# Patient Record
Sex: Female | Born: 1935 | Race: Black or African American | Hispanic: No | Marital: Married | Smoking: Current every day smoker
Health system: Southern US, Community
[De-identification: ages and names within clinical notes are randomized; demographics above are authoritative.]

## PROBLEM LIST (undated history)

## (undated) DIAGNOSIS — M1990 Unspecified osteoarthritis, unspecified site: Secondary | ICD-10-CM

## (undated) DIAGNOSIS — D509 Iron deficiency anemia, unspecified: Secondary | ICD-10-CM

## (undated) DIAGNOSIS — M810 Age-related osteoporosis without current pathological fracture: Secondary | ICD-10-CM

## (undated) DIAGNOSIS — I10 Essential (primary) hypertension: Secondary | ICD-10-CM

## (undated) DIAGNOSIS — D72819 Decreased white blood cell count, unspecified: Secondary | ICD-10-CM

## (undated) DIAGNOSIS — E119 Type 2 diabetes mellitus without complications: Secondary | ICD-10-CM

## (undated) DIAGNOSIS — F17200 Nicotine dependence, unspecified, uncomplicated: Secondary | ICD-10-CM

## (undated) DIAGNOSIS — E7800 Pure hypercholesterolemia, unspecified: Secondary | ICD-10-CM

## (undated) DIAGNOSIS — R9431 Abnormal electrocardiogram [ECG] [EKG]: Secondary | ICD-10-CM

## (undated) DIAGNOSIS — Z86010 Personal history of colonic polyps: Secondary | ICD-10-CM

## (undated) DIAGNOSIS — J309 Allergic rhinitis, unspecified: Secondary | ICD-10-CM

## (undated) DIAGNOSIS — M25519 Pain in unspecified shoulder: Secondary | ICD-10-CM

## (undated) HISTORY — DX: Personal history of colonic polyps: Z86.010

## (undated) HISTORY — DX: Decreased white blood cell count, unspecified: D72.819

## (undated) HISTORY — DX: Unspecified osteoarthritis, unspecified site: M19.90

## (undated) HISTORY — DX: Iron deficiency anemia, unspecified: D50.9

## (undated) HISTORY — DX: Type 2 diabetes mellitus without complications: E11.9

## (undated) HISTORY — PX: OTHER SURGICAL HISTORY: SHX169

## (undated) HISTORY — DX: Pain in unspecified shoulder: M25.519

## (undated) HISTORY — DX: Allergic rhinitis, unspecified: J30.9

## (undated) HISTORY — DX: Pure hypercholesterolemia, unspecified: E78.00

## (undated) HISTORY — DX: Age-related osteoporosis without current pathological fracture: M81.0

## (undated) HISTORY — DX: Abnormal electrocardiogram (ECG) (EKG): R94.31

## (undated) HISTORY — DX: Nicotine dependence, unspecified, uncomplicated: F17.200

## (undated) HISTORY — DX: Essential (primary) hypertension: I10

## (undated) HISTORY — PX: DILATION AND CURETTAGE OF UTERUS: SHX78

---

## 1999-09-14 ENCOUNTER — Ambulatory Visit (HOSPITAL_COMMUNITY): Admission: RE | Admit: 1999-09-14 | Discharge: 1999-09-14 | Payer: Self-pay | Admitting: Endocrinology

## 1999-09-14 ENCOUNTER — Encounter: Payer: Self-pay | Admitting: Endocrinology

## 1999-11-09 ENCOUNTER — Other Ambulatory Visit: Admission: RE | Admit: 1999-11-09 | Discharge: 1999-11-09 | Payer: Self-pay | Admitting: Gastroenterology

## 1999-11-09 ENCOUNTER — Encounter (INDEPENDENT_AMBULATORY_CARE_PROVIDER_SITE_OTHER): Payer: Self-pay | Admitting: Specialist

## 2002-02-04 ENCOUNTER — Other Ambulatory Visit: Admission: RE | Admit: 2002-02-04 | Discharge: 2002-02-04 | Payer: Self-pay | Admitting: Endocrinology

## 2002-05-29 ENCOUNTER — Encounter: Payer: Self-pay | Admitting: Gastroenterology

## 2002-05-29 ENCOUNTER — Ambulatory Visit (HOSPITAL_COMMUNITY): Admission: RE | Admit: 2002-05-29 | Discharge: 2002-05-29 | Payer: Self-pay | Admitting: Gastroenterology

## 2004-03-01 ENCOUNTER — Ambulatory Visit: Payer: Self-pay | Admitting: Endocrinology

## 2004-06-16 ENCOUNTER — Ambulatory Visit: Payer: Self-pay | Admitting: Endocrinology

## 2004-11-08 ENCOUNTER — Ambulatory Visit: Payer: Self-pay | Admitting: Endocrinology

## 2004-12-20 ENCOUNTER — Ambulatory Visit: Payer: Self-pay | Admitting: Endocrinology

## 2005-02-06 ENCOUNTER — Ambulatory Visit: Payer: Self-pay | Admitting: Endocrinology

## 2005-02-16 ENCOUNTER — Ambulatory Visit: Payer: Self-pay | Admitting: Endocrinology

## 2005-05-17 ENCOUNTER — Ambulatory Visit: Payer: Self-pay | Admitting: Endocrinology

## 2005-09-06 ENCOUNTER — Ambulatory Visit: Payer: Self-pay | Admitting: Endocrinology

## 2005-11-30 ENCOUNTER — Ambulatory Visit: Payer: Self-pay | Admitting: Gastroenterology

## 2005-12-01 ENCOUNTER — Encounter: Payer: Self-pay | Admitting: Gastroenterology

## 2005-12-01 ENCOUNTER — Ambulatory Visit: Payer: Self-pay | Admitting: Gastroenterology

## 2005-12-01 LAB — HM COLONOSCOPY

## 2006-01-04 ENCOUNTER — Ambulatory Visit: Payer: Self-pay | Admitting: Internal Medicine

## 2006-02-28 ENCOUNTER — Ambulatory Visit: Payer: Self-pay | Admitting: Endocrinology

## 2006-04-12 ENCOUNTER — Ambulatory Visit: Payer: Self-pay | Admitting: Endocrinology

## 2006-05-03 ENCOUNTER — Ambulatory Visit: Payer: Self-pay | Admitting: Endocrinology

## 2006-08-10 ENCOUNTER — Ambulatory Visit: Payer: Self-pay | Admitting: Endocrinology

## 2007-01-03 ENCOUNTER — Encounter: Payer: Self-pay | Admitting: *Deleted

## 2007-01-03 DIAGNOSIS — Z860100 Personal history of colon polyps, unspecified: Secondary | ICD-10-CM

## 2007-01-03 DIAGNOSIS — I10 Essential (primary) hypertension: Secondary | ICD-10-CM

## 2007-01-03 DIAGNOSIS — M810 Age-related osteoporosis without current pathological fracture: Secondary | ICD-10-CM

## 2007-01-03 DIAGNOSIS — J309 Allergic rhinitis, unspecified: Secondary | ICD-10-CM | POA: Insufficient documentation

## 2007-01-03 DIAGNOSIS — E119 Type 2 diabetes mellitus without complications: Secondary | ICD-10-CM | POA: Insufficient documentation

## 2007-01-03 DIAGNOSIS — M1990 Unspecified osteoarthritis, unspecified site: Secondary | ICD-10-CM

## 2007-01-03 HISTORY — DX: Unspecified osteoarthritis, unspecified site: M19.90

## 2007-01-03 HISTORY — DX: Type 2 diabetes mellitus without complications: E11.9

## 2007-01-03 HISTORY — DX: Personal history of colon polyps, unspecified: Z86.0100

## 2007-01-03 HISTORY — DX: Personal history of colonic polyps: Z86.010

## 2007-01-03 HISTORY — DX: Essential (primary) hypertension: I10

## 2007-01-03 HISTORY — DX: Allergic rhinitis, unspecified: J30.9

## 2007-01-03 HISTORY — DX: Age-related osteoporosis without current pathological fracture: M81.0

## 2007-02-12 ENCOUNTER — Ambulatory Visit: Payer: Self-pay | Admitting: Endocrinology

## 2007-02-12 DIAGNOSIS — R9431 Abnormal electrocardiogram [ECG] [EKG]: Secondary | ICD-10-CM

## 2007-02-12 HISTORY — DX: Abnormal electrocardiogram (ECG) (EKG): R94.31

## 2007-02-12 LAB — CONVERTED CEMR LAB
ALT: 14 units/L (ref 0–35)
AST: 23 units/L (ref 0–37)
Albumin: 3.7 g/dL (ref 3.5–5.2)
Alkaline Phosphatase: 56 units/L (ref 39–117)
BUN: 17 mg/dL (ref 6–23)
Basophils Absolute: 0.1 10*3/uL (ref 0.0–0.1)
Basophils Relative: 1.2 % — ABNORMAL HIGH (ref 0.0–1.0)
Bilirubin, Direct: 0.1 mg/dL (ref 0.0–0.3)
CO2: 31 meq/L (ref 19–32)
Calcium: 9.9 mg/dL (ref 8.4–10.5)
Chloride: 101 meq/L (ref 96–112)
Cholesterol: 166 mg/dL (ref 0–200)
Creatinine, Ser: 0.8 mg/dL (ref 0.4–1.2)
Creatinine,U: 84.8 mg/dL
Eosinophils Absolute: 0.1 10*3/uL (ref 0.0–0.6)
Eosinophils Relative: 1.6 % (ref 0.0–5.0)
GFR calc Af Amer: 88 mL/min
GFR calc non Af Amer: 73 mL/min
Glucose, Bld: 82 mg/dL (ref 70–99)
HCT: 32.5 % — ABNORMAL LOW (ref 36.0–46.0)
HDL: 81.3 mg/dL (ref >39.0)
Hemoglobin: 11.3 g/dL — ABNORMAL LOW (ref 12.0–15.0)
Hgb A1c MFr Bld: 6.3 % — ABNORMAL HIGH (ref 4.6–6.0)
LDL Cholesterol: 76 mg/dL (ref 0–99)
Lymphocytes Relative: 40.9 % (ref 12.0–46.0)
MCHC: 34.7 g/dL (ref 30.0–36.0)
MCV: 92.7 fL (ref 78.0–100.0)
Microalb Creat Ratio: 10.6 mg/g (ref 0.0–30.0)
Microalb, Ur: 0.9 mg/dL (ref 0.0–1.9)
Monocytes Absolute: 0.4 10*3/uL (ref 0.2–0.7)
Monocytes Relative: 7.8 % (ref 3.0–11.0)
Neutro Abs: 2.2 10*3/uL (ref 1.4–7.7)
Neutrophils Relative %: 48.5 % (ref 43.0–77.0)
Platelets: 208 10*3/uL (ref 150–400)
Potassium: 4.5 meq/L (ref 3.5–5.1)
RBC: 3.51 M/uL — ABNORMAL LOW (ref 3.87–5.11)
RDW: 14 % (ref 11.5–14.6)
Sed Rate: 25 mm/hr (ref 0–25)
Sodium: 138 meq/L (ref 135–145)
TSH: 1.21 microintl units/mL (ref 0.35–5.50)
Total Bilirubin: 0.6 mg/dL (ref 0.3–1.2)
Total CHOL/HDL Ratio: 2
Total Protein: 7.1 g/dL (ref 6.0–8.3)
Triglycerides: 44 mg/dL (ref 0–149)
VLDL: 9 mg/dL (ref 0–40)
WBC: 4.7 10*3/uL (ref 4.5–10.5)

## 2007-02-19 ENCOUNTER — Ambulatory Visit: Payer: Self-pay

## 2007-02-19 ENCOUNTER — Encounter: Payer: Self-pay | Admitting: Endocrinology

## 2007-08-12 ENCOUNTER — Encounter: Payer: Self-pay | Admitting: Endocrinology

## 2007-08-15 ENCOUNTER — Ambulatory Visit: Payer: Self-pay | Admitting: Endocrinology

## 2007-08-15 ENCOUNTER — Encounter (INDEPENDENT_AMBULATORY_CARE_PROVIDER_SITE_OTHER): Payer: Self-pay | Admitting: *Deleted

## 2007-08-15 DIAGNOSIS — D509 Iron deficiency anemia, unspecified: Secondary | ICD-10-CM

## 2007-08-15 HISTORY — DX: Iron deficiency anemia, unspecified: D50.9

## 2007-08-15 LAB — CONVERTED CEMR LAB
Basophils Absolute: 0 10*3/uL (ref 0.0–0.1)
Basophils Relative: 0.9 % (ref 0.0–1.0)
Eosinophils Absolute: 0 10*3/uL (ref 0.0–0.7)
Eosinophils Relative: 1.1 % (ref 0.0–5.0)
HCT: 33.9 % — ABNORMAL LOW (ref 36.0–46.0)
Hemoglobin: 11.4 g/dL — ABNORMAL LOW (ref 12.0–15.0)
Hgb A1c MFr Bld: 6.3 % — ABNORMAL HIGH (ref 4.6–6.0)
Iron: 71 ug/dL (ref 42–145)
Lymphocytes Relative: 28.4 % (ref 12.0–46.0)
MCHC: 33.6 g/dL (ref 30.0–36.0)
MCV: 93.3 fL (ref 78.0–100.0)
Monocytes Absolute: 0.3 10*3/uL (ref 0.1–1.0)
Monocytes Relative: 6.3 % (ref 3.0–12.0)
Neutro Abs: 2.7 10*3/uL (ref 1.4–7.7)
Neutrophils Relative %: 63.3 % (ref 43.0–77.0)
Platelets: 194 10*3/uL (ref 150–400)
RBC: 3.63 M/uL — ABNORMAL LOW (ref 3.87–5.11)
RDW: 14 % (ref 11.5–14.6)
Saturation Ratios: 16.2 % — ABNORMAL LOW (ref 20.0–50.0)
Transferrin: 312.1 mg/dL (ref >=212.0)
WBC: 4.2 10*3/uL — ABNORMAL LOW (ref 4.5–10.5)

## 2007-09-30 ENCOUNTER — Ambulatory Visit: Payer: Self-pay | Admitting: Gastroenterology

## 2007-10-04 ENCOUNTER — Ambulatory Visit: Payer: Self-pay | Admitting: Gastroenterology

## 2007-10-04 ENCOUNTER — Encounter: Payer: Self-pay | Admitting: Gastroenterology

## 2007-10-07 ENCOUNTER — Ambulatory Visit: Payer: Self-pay | Admitting: Gastroenterology

## 2007-10-08 LAB — CONVERTED CEMR LAB
Fecal Occult Blood: NEGATIVE
OCCULT 1: NEGATIVE
OCCULT 2: NEGATIVE
OCCULT 3: NEGATIVE
OCCULT 4: NEGATIVE
OCCULT 5: NEGATIVE

## 2007-10-16 ENCOUNTER — Encounter: Payer: Self-pay | Admitting: Gastroenterology

## 2007-11-04 ENCOUNTER — Ambulatory Visit: Payer: Self-pay | Admitting: Gastroenterology

## 2007-11-04 LAB — CONVERTED CEMR LAB
Basophils Absolute: 0.1 10*3/uL (ref 0.0–0.1)
Basophils Relative: 1 % (ref 0.0–3.0)
Eosinophils Absolute: 0.1 10*3/uL (ref 0.0–0.7)
Eosinophils Relative: 1.4 % (ref 0.0–5.0)
HCT: 33.2 % — ABNORMAL LOW (ref 36.0–46.0)
Hemoglobin: 11.5 g/dL — ABNORMAL LOW (ref 12.0–15.0)
Lymphocytes Relative: 40.9 % (ref 12.0–46.0)
MCHC: 34.7 g/dL (ref 30.0–36.0)
MCV: 93.6 fL (ref 78.0–100.0)
Monocytes Absolute: 0.4 10*3/uL (ref 0.1–1.0)
Monocytes Relative: 7.8 % (ref 3.0–12.0)
Neutro Abs: 2.6 10*3/uL (ref 1.4–7.7)
Neutrophils Relative %: 48.9 % (ref 43.0–77.0)
Platelets: 199 10*3/uL (ref 150–400)
RBC: 3.54 M/uL — ABNORMAL LOW (ref 3.87–5.11)
RDW: 13.5 % (ref 11.5–14.6)
WBC: 5.4 10*3/uL (ref 4.5–10.5)

## 2007-11-19 ENCOUNTER — Ambulatory Visit: Payer: Self-pay | Admitting: Gastroenterology

## 2008-01-17 ENCOUNTER — Ambulatory Visit: Payer: Self-pay | Admitting: Endocrinology

## 2008-02-06 ENCOUNTER — Ambulatory Visit: Payer: Self-pay | Admitting: Family Medicine

## 2008-02-06 ENCOUNTER — Encounter: Payer: Self-pay | Admitting: Endocrinology

## 2008-02-18 ENCOUNTER — Ambulatory Visit: Payer: Self-pay | Admitting: Endocrinology

## 2008-02-18 DIAGNOSIS — M25519 Pain in unspecified shoulder: Secondary | ICD-10-CM

## 2008-02-18 HISTORY — DX: Pain in unspecified shoulder: M25.519

## 2008-02-18 LAB — CONVERTED CEMR LAB: Hgb A1c MFr Bld: 6.2 % — ABNORMAL HIGH (ref 4.6–6.0)

## 2008-02-24 LAB — CONVERTED CEMR LAB
Albumin ELP: 56.6 % (ref 55.8–66.1)
Alpha-1-Globulin: 4.6 % (ref 2.9–4.9)
PTH: 24.5 pg/mL (ref 14.0–72.0)
Total Protein, Serum Electrophoresis: 7.1 g/dL (ref 6.0–8.3)

## 2008-03-04 ENCOUNTER — Ambulatory Visit: Payer: Self-pay

## 2008-05-21 ENCOUNTER — Ambulatory Visit: Payer: Self-pay | Admitting: Endocrinology

## 2008-05-27 LAB — CONVERTED CEMR LAB
ALT: 13 units/L (ref 0–35)
Bilirubin, Direct: 0.1 mg/dL (ref 0.0–0.3)
CO2: 31 meq/L (ref 19–32)
Calcium: 9.7 mg/dL (ref 8.4–10.5)
HDL: 83.8 mg/dL (ref >39.0)
Iron: 82 ug/dL (ref 42–145)
LDL Cholesterol: 45 mg/dL (ref 0–99)
Lymphocytes Relative: 37.5 % (ref 12.0–46.0)
Microalb Creat Ratio: 9.7 mg/g (ref 0.0–30.0)
Microalb, Ur: 2.1 mg/dL — ABNORMAL HIGH (ref 0.0–1.9)
Monocytes Relative: 6.9 % (ref 3.0–12.0)
Neutrophils Relative %: 53.9 % (ref 43.0–77.0)
Nitrite: NEGATIVE
Platelets: 207 10*3/uL (ref 150–400)
RDW: 14.1 % (ref 11.5–14.6)
Saturation Ratios: 19.5 % — ABNORMAL LOW (ref 20.0–50.0)
Sodium: 140 meq/L (ref 135–145)
Specific Gravity, Urine: >=1.03 (ref 1.000–1.03)
Total Bilirubin: 0.7 mg/dL (ref 0.3–1.2)
Total CHOL/HDL Ratio: 1.7
Transferrin: 300.3 mg/dL (ref >=212.0)
Triglycerides: 49 mg/dL (ref 0–149)
pH: 5.5 (ref 5.0–8.0)

## 2008-08-03 ENCOUNTER — Telehealth (INDEPENDENT_AMBULATORY_CARE_PROVIDER_SITE_OTHER): Payer: Self-pay | Admitting: *Deleted

## 2008-08-04 ENCOUNTER — Ambulatory Visit: Payer: Self-pay | Admitting: Endocrinology

## 2009-01-05 ENCOUNTER — Telehealth (INDEPENDENT_AMBULATORY_CARE_PROVIDER_SITE_OTHER): Payer: Self-pay | Admitting: *Deleted

## 2009-01-08 ENCOUNTER — Ambulatory Visit: Payer: Self-pay | Admitting: Endocrinology

## 2009-01-08 LAB — CONVERTED CEMR LAB
Basophils Relative: 0.4 % (ref 0.0–3.0)
Eosinophils Absolute: 0.1 10*3/uL (ref 0.0–0.7)
HCT: 35.2 % — ABNORMAL LOW (ref 36.0–46.0)
Hemoglobin: 12.1 g/dL (ref 12.0–15.0)
MCHC: 34.5 g/dL (ref 30.0–36.0)
MCV: 94.2 fL (ref 78.0–100.0)
Monocytes Absolute: 0.3 10*3/uL (ref 0.1–1.0)
Neutro Abs: 2.4 10*3/uL (ref 1.4–7.7)
RBC: 3.74 M/uL — ABNORMAL LOW (ref 3.87–5.11)

## 2009-05-21 ENCOUNTER — Encounter: Payer: Self-pay | Admitting: Endocrinology

## 2009-06-01 ENCOUNTER — Ambulatory Visit: Payer: Self-pay | Admitting: Endocrinology

## 2009-06-01 DIAGNOSIS — D72819 Decreased white blood cell count, unspecified: Secondary | ICD-10-CM | POA: Insufficient documentation

## 2009-06-01 DIAGNOSIS — E7800 Pure hypercholesterolemia, unspecified: Secondary | ICD-10-CM

## 2009-06-01 HISTORY — DX: Decreased white blood cell count, unspecified: D72.819

## 2009-06-01 HISTORY — DX: Pure hypercholesterolemia, unspecified: E78.00

## 2009-06-01 LAB — CONVERTED CEMR LAB
Calcium, Total (PTH): 9.7 mg/dL (ref 8.4–10.5)
PTH: 22 pg/mL (ref 14.0–72.0)

## 2009-06-03 LAB — CONVERTED CEMR LAB
Alkaline Phosphatase: 88 units/L (ref 39–117)
BUN: 17 mg/dL (ref 6–23)
Basophils Relative: 1.2 % (ref 0.0–3.0)
Bilirubin Urine: NEGATIVE
Bilirubin, Direct: 0.2 mg/dL (ref 0.0–0.3)
CO2: 32 meq/L (ref 19–32)
Calcium: 9.6 mg/dL (ref 8.4–10.5)
Cholesterol: 152 mg/dL (ref 0–200)
Creatinine, Ser: 0.7 mg/dL (ref 0.4–1.2)
Eosinophils Absolute: 0.1 10*3/uL (ref 0.0–0.7)
HDL: 97.8 mg/dL (ref >39.00)
Hemoglobin, Urine: NEGATIVE
Hgb A1c MFr Bld: 6.3 % (ref 4.6–6.5)
Iron: 54 ug/dL (ref 42–145)
Ketones, ur: NEGATIVE mg/dL
LDL Cholesterol: 48 mg/dL (ref 0–99)
Lymphocytes Relative: 36 % (ref 12.0–46.0)
MCHC: 33 g/dL (ref 30.0–36.0)
Neutrophils Relative %: 53.8 % (ref 43.0–77.0)
Platelets: 228 10*3/uL (ref 150.0–400.0)
RBC: 3.24 M/uL — ABNORMAL LOW (ref 3.87–5.11)
Saturation Ratios: 12.1 % — ABNORMAL LOW (ref 20.0–50.0)
Total Bilirubin: 0.5 mg/dL (ref 0.3–1.2)
Total CHOL/HDL Ratio: 2
Total Protein: 7.1 g/dL (ref 6.0–8.3)
Transferrin: 318.1 mg/dL (ref 212.0–360.0)
Triglycerides: 33 mg/dL (ref 0.0–149.0)
Urine Glucose: NEGATIVE mg/dL
Urobilinogen, UA: 0.2 (ref 0.0–1.0)
WBC: 4.2 10*3/uL — ABNORMAL LOW (ref 4.5–10.5)

## 2009-07-16 ENCOUNTER — Ambulatory Visit: Payer: Self-pay | Admitting: Endocrinology

## 2009-07-21 ENCOUNTER — Telehealth: Payer: Self-pay | Admitting: Internal Medicine

## 2009-12-24 ENCOUNTER — Ambulatory Visit: Payer: Self-pay | Admitting: Endocrinology

## 2009-12-24 LAB — CONVERTED CEMR LAB
Basophils Absolute: 0 10*3/uL (ref 0.0–0.1)
Basophils Relative: 0.6 % (ref 0.0–3.0)
Eosinophils Absolute: 0.1 10*3/uL (ref 0.0–0.7)
Eosinophils Relative: 1.5 % (ref 0.0–5.0)
HCT: 28.5 % — ABNORMAL LOW (ref 36.0–46.0)
Hemoglobin: 9.6 g/dL — ABNORMAL LOW (ref 12.0–15.0)
Hgb A1c MFr Bld: 6.5 % (ref 4.6–6.5)
Iron: 35 ug/dL — ABNORMAL LOW (ref 42–145)
Lymphocytes Relative: 41.2 % (ref 12.0–46.0)
Lymphs Abs: 2.2 10*3/uL (ref 0.7–4.0)
MCHC: 33.9 g/dL (ref 30.0–36.0)
MCV: 89.1 fL (ref 78.0–100.0)
Monocytes Absolute: 0.4 10*3/uL (ref 0.1–1.0)
Monocytes Relative: 7 % (ref 3.0–12.0)
Neutro Abs: 2.6 10*3/uL (ref 1.4–7.7)
Neutrophils Relative %: 49.7 % (ref 43.0–77.0)
Platelets: 220 10*3/uL (ref 150.0–400.0)
RBC: 3.2 M/uL — ABNORMAL LOW (ref 3.87–5.11)
RDW: 18 % — ABNORMAL HIGH (ref 11.5–14.6)
Saturation Ratios: 7.1 % — ABNORMAL LOW (ref 20.0–50.0)
Transferrin: 353.2 mg/dL (ref 212.0–360.0)
WBC: 5.3 10*3/uL (ref 4.5–10.5)

## 2010-05-10 NOTE — Assessment & Plan Note (Signed)
Summary: f/u appt/#cd   Vital Signs:  Patient profile:   77 year old female Height:      63 inches (160.02 cm) Weight:      126.50 pounds (57.50 kg) BMI:     22.49 O2 Sat:      94 % on Room air Temp:     97.0 degrees F (36.11 degrees C) oral Pulse rate:   96 / minute BP sitting:   124 / 68  (left arm) Cuff size:   regular  Vitals Entered By: Brenton Grills MA (December 24, 2009 2:05 PM)  O2 Flow:  Room air CC: Follow-up visit/aj Is Patient Diabetic? Yes   Primary Provider:  Romero Belling, MD  CC:  Follow-up visit/aj.  History of Present Illness: the status of at least 3 ongoing medical problems is addressed today: osteoarthritis:  pt says percocet 10/350, is too strong,  while vicodin is not strong enough.  pain is worst at the left shoulder. dm:  no cbg record, but states cbg's are well-controlled.  appetite is "good." anemia:  no hematuria.  she takes fe 1/day.    Current Medications (verified): 1)  Metformin Hcl 1000 Mg  Tabs (Metformin Hcl) .... Take 1 By Mouth Two Times A Day Qd 2)  Lisinopril-Hydrochlorothiazide 20-12.5 Mg  Tabs (Lisinopril-Hydrochlorothiazide) .... Take 2 By Mouth Qd 3)  Lovastatin 40 Mg  Tabs (Lovastatin) .... Take 2 By Mouth Qhs 4)  Actos 45 Mg  Tabs (Pioglitazone Hcl) .... Take 1 By Mouth Qd 5)  Adult Aspirin Low Strength 81 Mg  Tbdp (Aspirin) .... Take 1 By Mouth Qd 6)  Pepcid 40 Mg  Tabs (Famotidine) .Marland Kitchen.. 1 Tab Once Daily 7)  Iron 325 (65 Fe) Mg Tabs (Ferrous Sulfate) .... 2 Tabs Two Times A Day 8)  Oxycodone-Acetaminophen 10-325 Mg Tabs (Oxycodone-Acetaminophen) .Marland Kitchen.. 1 Every 4 Hrs As Needed For Pain  Allergies (verified): 1)  ! Penicillin  Past History:  Past Medical History: Last updated: 01/03/2007 Allergic rhinitis Colonic polyps, hx of Diabetes mellitus, type II Hypertension Osteoarthritis Osteoporosis Smoker Diabetic Retinopathy Abnormal Mammography  Review of Systems  The patient denies weight loss and weight gain.     Physical Exam  General:  normal appearance.   Msk:  left shoulder: nontender.  full rom, with slight pain. Additional Exam:  Hemoglobin           [L]  9.6 g/dL                    16.1-09.6 Hematocrit           [L]  28.5 %     Iron Saturation      [L]  7.1 %                       20.0-50.0 Hemoglobin A1C            6.5 %      Impression & Recommendations:  Problem # 1:  DIABETES MELLITUS, TYPE II (ICD-250.00) well-controlled  Problem # 2:  ANEMIA, IRON DEFICIENCY (ICD-280.9) needs increased rx  Problem # 3:  OSTEOARTHRITIS (ICD-715.90) pain needs medication adjustment  Medications Added to Medication List This Visit: 1)  Percocet 5-325 Mg Tabs (Oxycodone-acetaminophen) .... 1/2 or 1 every 4 hrs as needed for pain 2)  Onetouch Ultra Blue Strp (Glucose blood) .... Once daily, and lancets 250.00  Other Orders: TLB-CBC Platelet - w/Differential (85025-CBCD) TLB-IBC Pnl (Iron/FE;Transferrin) (83550-IBC) TLB-A1C / Hgb A1C (Glycohemoglobin) (83036-A1C)  Est. Patient Level IV ([address])  Patient Instructions: 1)  blood tests are being ordered for you today.  please call [phone] to hear your test results. 2)  reduce oxycodone/apap to 5/325:  1/2-1 every 4 hrn as needed for pain. 3)  here is a new blood sugar meter.  i have sent a prescription for strips to your pharmacy.   4)  Please schedule a physical appointment in 5 months. 5)  (update: i left message on phone-tree:  increase fe to 2-bid.  go to lab in 1 month). Prescriptions: ONETOUCH ULTRA BLUE  STRP (GLUCOSE BLOOD) once daily, and lancets 250.00  #100 x 3   Entered and Authorized by:   Minus Breeding MD   Signed by:   Minus Breeding MD on 12/24/2009   Method used:   Electronically to        [facility]  [id number] (retail)       [address]       [place], [place]  [address]       Ph: [phone] or [phone]       Fax: [phone]   RxID:   [id number] PERCOCET 5-325 MG TABS  (OXYCODONE-ACETAMINOPHEN) 1/2 or 1 every 4 hrs as needed for pain  #100 x 0   Entered and Authorized by:   Minus Breeding MD   Signed by:   Minus Breeding MD on 12/24/2009   Method used:   Print then Give to Patient   RxID:   [id number]

## 2010-05-10 NOTE — Assessment & Plan Note (Signed)
Summary: FEB YEARLY--#--STC   Vital Signs:  Patient profile:   77 year old female Height:      63 inches (160.02 cm) Weight:      125 pounds (56.82 kg) O2 Sat:      94 % on Room air Temp:     97.5 degrees F (36.39 degrees C) oral Pulse rate:   92 / minute BP sitting:   130 / 60  (left arm) Cuff size:   regular  Vitals Entered By: Josph Macho RMA (June 01, 2009 9:05 AM)  O2 Flow:  Room air CC: Yearly/ CF   Primary Provider:  Romero Belling, MD  CC:  Yearly/ CF.  History of Present Illness: here for regular wellness examination.  she's feeling pretty well in general, and does not drink etoh.  she takes fe 2/day.   Current Medications (verified): 1)  Metformin Hcl 1000 Mg  Tabs (Metformin Hcl) .... Take 1 By Mouth Two Times A Day Qd 2)  Lisinopril-Hydrochlorothiazide 20-12.5 Mg  Tabs (Lisinopril-Hydrochlorothiazide) .... Take 2 By Mouth Qd 3)  Lovastatin 40 Mg  Tabs (Lovastatin) .... Take 2 By Mouth Qhs 4)  Actos 45 Mg  Tabs (Pioglitazone Hcl) .... Take 1 By Mouth Qd 5)  Adult Aspirin Low Strength 81 Mg  Tbdp (Aspirin) .... Take 1 By Mouth Qd 6)  Vicodin 5-500 Mg  Tabs (Hydrocodone-Acetaminophen) .... One Tablet Every 4 Hours As Needed For Pain - Needs Return Office Visit For Further Refills 7)  Pepcid 40 Mg  Tabs (Famotidine) .Marland Kitchen.. 1 Tab Once Daily  Allergies (verified): 1)  ! Penicillin  Past History:  Past Medical History: Last updated: 01/03/2007 Allergic rhinitis Colonic polyps, hx of Diabetes mellitus, type II Hypertension Osteoarthritis Osteoporosis Smoker Diabetic Retinopathy Abnormal Mammography  Past Surgical History: Child Birth x 5 D & C  Family History: Reviewed history from 09/30/2007 and no changes required. patient is unaware of anybody in her immediate family having cancer Family History of Diabetes: Sister Family History of Liver Disease/Cirrhosis: Daughter-transplant  Social History: Reviewed history from 11/19/2007 and no changes  required. patient is married she is retired Patient currently smokes <1/2 ppd Daily Caffeine Use 2 cups coffee in AM Illicit Drug Use - no Patient does not get regular exercise.  Alcohol Use - no  Review of Systems  The patient denies fever, weight loss, weight gain, vision loss, decreased hearing, chest pain, syncope, dyspnea on exertion, prolonged cough, abdominal pain, melena, hematochezia, severe indigestion/heartburn, hematuria, suspicious skin lesions, and depression.         she has intermittent headache  Physical Exam  General:  normal appearance.   Head:  head: no deformity eyes: no periorbital swelling, no proptosis external nose and ears are normal mouth: no lesion seen Neck:  Supple without thyroid enlargement or tenderness.  Breasts:  No tenderness, masses, nipple discharge, or skin abnormalities.  Lungs:  Clear to auscultation bilaterally. Normal respiratory effort.  Heart:  Regular rate and rhythm without murmurs or gallops noted. Normal S1,S2.   Abdomen:  abdomen is soft, nontender.  no hepatosplenomegaly.   not distended.  no hernia  Rectal:  normal external and internal exam.  heme neg  Genitalia:  declined Msk:  muscle bulk and strength are grossly normal.  no obvious joint swelling.  gait is normal and steady  Pulses:  dorsalis pedis intact bilat.  no carotid bruit  Extremities:  no deformity.  no ulcer on the feet.  feet are of normal color and temp.  no edema  Neurologic:  cn 2-12 grossly intact.   readily moves all 4's.   sensation is intact to touch on the feet  Skin:  normal texture and temp.  no rash.  not diaphoretic  Cervical Nodes:  No significant adenopathy.  Psych:  Alert and cooperative; normal mood and affect; normal attention span and concentration.     Impression & Recommendations:  Problem # 1:  ROUTINE GENERAL MEDICAL EXAM@HEALTH  CARE FACL (ICD-V70.0)  Orders: Est. Patient 65& > (59563)  Medications Added to Medication List  This Visit: 1)  Iron 325 (65 Fe) Mg Tabs (Ferrous sulfate) .... 2 tabs two times a day  Other Orders: T-Parathyroid Hormone, Intact w/ Calcium (87564-33295) EKG w/ Interpretation (93000) TLB-Lipid Panel (80061-LIPID) TLB-BMP (Basic Metabolic Panel-BMET) (80048-METABOL) TLB-CBC Platelet - w/Differential (85025-CBCD) TLB-Hepatic/Liver Function Pnl (80076-HEPATIC) TLB-TSH (Thyroid Stimulating Hormone) (84443-TSH) TLB-A1C / Hgb A1C (Glycohemoglobin) (83036-A1C) TLB-Udip w/ Micro (81001-URINE) TLB-IBC Pnl (Iron/FE;Transferrin) (83550-IBC)   Patient Instructions: 1)  tests are being ordered for you today.  a few days after the test(s), please call [phone] to hear your test results. 2)  return 6 months

## 2010-05-10 NOTE — Assessment & Plan Note (Signed)
Summary: ARM PAIN/NWS   Vital Signs:  Patient profile:   77 year old female Height:      63 inches (160.02 cm) Weight:      121.38 pounds (55.17 kg) O2 Sat:      95 % on Room air Temp:     98.9 degrees F (37.17 degrees C) oral Pulse rate:   100 / minute BP sitting:   118 / 62  (left arm) Cuff size:   regular  Vitals Entered By: Josph Macho RMA (July 16, 2009 10:25 AM)  O2 Flow:  Room air CC: Left arm pain- starts at shoulder and goes towards hand/ CF Is Patient Diabetic? Yes   Primary Provider:  Romero Belling, MD  CC:  Left arm pain- starts at shoulder and goes towards hand/ CF.  History of Present Illness: pt states 2 years of pain radiating from the left shoulder to the flexor aspect of the left forearm.  no associated numbness.  she says ortho.  steriod injections did not help.  surgery was not advised.  she gets incomplete relief with vicodin.    Current Medications (verified): 1)  Metformin Hcl 1000 Mg  Tabs (Metformin Hcl) .... Take 1 By Mouth Two Times A Day Qd 2)  Lisinopril-Hydrochlorothiazide 20-12.5 Mg  Tabs (Lisinopril-Hydrochlorothiazide) .... Take 2 By Mouth Qd 3)  Lovastatin 40 Mg  Tabs (Lovastatin) .... Take 2 By Mouth Qhs 4)  Actos 45 Mg  Tabs (Pioglitazone Hcl) .... Take 1 By Mouth Qd 5)  Adult Aspirin Low Strength 81 Mg  Tbdp (Aspirin) .... Take 1 By Mouth Qd 6)  Vicodin 5-500 Mg  Tabs (Hydrocodone-Acetaminophen) .... One Tablet Every 4 Hours As Needed For Pain - Needs Return Office Visit For Further Refills 7)  Pepcid 40 Mg  Tabs (Famotidine) .Marland Kitchen.. 1 Tab Once Daily 8)  Iron 325 (65 Fe) Mg Tabs (Ferrous Sulfate) .... 2 Tabs Two Times A Day  Allergies (verified): 1)  ! Penicillin  Past History:  Past Medical History: Last updated: 01/03/2007 Allergic rhinitis Colonic polyps, hx of Diabetes mellitus, type II Hypertension Osteoarthritis Osteoporosis Smoker Diabetic Retinopathy Abnormal Mammography  Review of Systems       she also has neck  pain.  denies weakness of the lue.  Physical Exam  General:  elderly, frail, no distress  Msk:  left shoulder: full rom, but rom is painful there is normal strength throughout the lue. no deformity Neurologic:  sensation is intact to touch on the lue   Impression & Recommendations:  Problem # 1:  SHOULDER PAIN, LEFT (ICD-719.41) chronic  Medications Added to Medication List This Visit: 1)  Oxycodone-acetaminophen 10-325 Mg Tabs (Oxycodone-acetaminophen) .Marland Kitchen.. 1 every 4 hrs as needed for pain  Other Orders: Est. Patient Level III ([address])  Patient Instructions: 1)  oxycodone 10/325, 1 every 4 hrs as needed for pain.  here is a prescription to use on a trial basis.  please call if it helps. Prescriptions: OXYCODONE-ACETAMINOPHEN 10-325 MG TABS (OXYCODONE-ACETAMINOPHEN) 1 every 4 hrs as needed for pain  #50 x 0   Entered and Authorized by:   Minus Breeding MD   Signed by:   Minus Breeding MD on 07/16/2009   Method used:   Print then Give to Patient   RxID:   [id number]

## 2010-05-10 NOTE — Progress Notes (Signed)
Summary: side effect?  Phone Note Call from Patient Call back at Home Phone [phone]   Caller: Granddaughter Summary of Call: pt granddaughter called to inform MD that Rx for pain is causing pt stomach upset. pt is requesting alternate Rx Initial call taken by: Margaret Pyle, CMA,  July 21, 2009 1:09 PM  Follow-up for Phone Call        take only 1/2 tab of pain pill every 4 hours as needed instead of whole tab (percocet 10 rx'd 4/8) - Follow-up by: Newt Lukes MD,  July 21, 2009 1:48 PM  Additional Follow-up for Phone Call Additional follow up Details #1::        pt informed and agrees Additional Follow-up by: Margaret Pyle, CMA,  July 21, 2009 2:03 PM

## 2010-05-18 ENCOUNTER — Ambulatory Visit (INDEPENDENT_AMBULATORY_CARE_PROVIDER_SITE_OTHER): Payer: MEDICARE | Admitting: Endocrinology

## 2010-05-18 ENCOUNTER — Encounter: Payer: Self-pay | Admitting: Endocrinology

## 2010-05-18 DIAGNOSIS — R110 Nausea: Secondary | ICD-10-CM | POA: Insufficient documentation

## 2010-05-26 NOTE — Assessment & Plan Note (Signed)
Summary: can not keep keep anything on her stomach  stc   Vital Signs:  Patient profile:   77 year old female Menstrual status:  hysterectomy Height:      63 inches (160.02 cm) Weight:      119.56 pounds (54.35 kg) BMI:     21.26 O2 Sat:      94 % on Room air Temp:     98.6 degrees F (37.00 degrees C) oral Pulse rate:   105 / minute BP supine:   115 / 60  (right arm) BP sitting:   117 / 60  (right arm) Cuff size:   regular  Vitals Entered By: Brenton Grills CMA Duncan Dull) (May 18, 2010 10:42 AM)  O2 Flow:  Room air CC: Vomiting and diarrhea since yesterday, chills/aj Is Patient Diabetic? Yes Comments Pt is due for a mammogram and has never had Zostavax      Menstrual Status hysterectomy   Primary Provider:  Romero Belling, MD  CC:  Vomiting and diarrhea since yesterday and chills/aj.  History of Present Illness: pt states 1 day of n/v, and abdominal cramps. she has assoc watery diarrhea.  she feels as though she can manage this at home, with symptomatic rx.  Current Medications (verified): 1)  Metformin Hcl 1000 Mg  Tabs (Metformin Hcl) .... Take 1 By Mouth Two Times A Day Qd 2)  Lisinopril-Hydrochlorothiazide 20-12.5 Mg  Tabs (Lisinopril-Hydrochlorothiazide) .... Take 2 By Mouth Qd 3)  Lovastatin 40 Mg  Tabs (Lovastatin) .... Take 2 By Mouth Qhs 4)  Actos 45 Mg  Tabs (Pioglitazone Hcl) .... Take 1 By Mouth Qd 5)  Adult Aspirin Low Strength 81 Mg  Tbdp (Aspirin) .... Take 1 By Mouth Qd 6)  Pepcid 40 Mg  Tabs (Famotidine) .Marland Kitchen.. 1 Tab Once Daily 7)  Iron 325 (65 Fe) Mg Tabs (Ferrous Sulfate) .... 2 Tabs Two Times A Day 8)  Percocet 5-325 Mg Tabs (Oxycodone-Acetaminophen) .... 1/2 or 1 Every 4 Hrs As Needed For Pain 9)  Onetouch Ultra Blue  Strp (Glucose Blood) .... Once Daily, and Lancets 250.00  Allergies (verified): 1)  ! Penicillin  Past History:  Past Medical History: Last updated: 01/03/2007 Allergic rhinitis Colonic polyps, hx of Diabetes mellitus, type  II Hypertension Osteoarthritis Osteoporosis Smoker Diabetic Retinopathy Abnormal Mammography  Review of Systems  The patient denies fever and hematochezia.    Physical Exam  General:  normal appearance.   Abdomen:  abdomen is soft, nontender.  no hepatosplenomegaly.   not distended.  no hernia    Impression & Recommendations:  Problem # 1:  NAUSEA (ICD-787.02) Assessment New due to gastroenteritis promethazine 12.5 mg im now please.  Problem # 2:  HYPERTENSION (ICD-401.9) zestoretic could exac sxs  Problem # 3:  DIABETES MELLITUS, TYPE II (ICD-250.00) metformin could exac sxs.  Medications Added to Medication List This Visit: 1)  Ondansetron Hcl 4 Mg Tabs (Ondansetron hcl) .Marland Kitchen.. 1 every 4 hrs as needed for nausea.  Other Orders: Promethazine up to 50mg  (J2550) Admin of Therapeutic Inj  intramuscular or subcutaneous (04540) Est. Patient Level IV (98119)  Patient Instructions: 1)  drink plenty of fluids, and rest 2)  for now, stop lisinopril-hctz, and metformin. 3)  take ondansetron 4 mg every 4 hrs as needed for nausea. 4)  return here in 2 days 5)  if you are worse, you should go to the emergency room.   Prescriptions: ONDANSETRON HCL 4 MG TABS (ONDANSETRON HCL) 1 every 4 hrs as needed for  nausea.  #30 x 0   Entered and Authorized by:   Minus Breeding MD   Signed by:   Minus Breeding MD on 05/18/2010   Method used:   Electronically to        [facility] (retail)       [address]       [place], [place]  [address]       Ph: [phone]       Fax: [phone]   RxID:   [id number]    Medication Administration  Injection # 1:    Medication: Promethazine up to 50mg     Diagnosis: NAUSEA (ICD-787.02)    Route: IM    Site: RUOQ gluteus    Exp Date: 09/2011    Lot #: [id number]    Mfr: Novaplus    Comments: administered 12.5mg  (0.20mL) of Promethazine    Patient tolerated injection without  complications    Given by: Brenton Grills CMA Duncan Dull) (May 18, 2010 11:10 AM)  Orders Added: 1)  Promethazine up to 50mg  [J2550] 2)  Admin of Therapeutic Inj  intramuscular or subcutaneous [96372] 3)  Est. Patient Level IV [40102]

## 2010-07-19 ENCOUNTER — Other Ambulatory Visit: Payer: Self-pay | Admitting: Endocrinology

## 2010-08-01 ENCOUNTER — Emergency Department (HOSPITAL_COMMUNITY)
Admission: EM | Admit: 2010-08-01 | Discharge: 2010-08-01 | Disposition: A | Discharge status: Home / Self Care | Payer: Medicare Other | Attending: Emergency Medicine | Admitting: Emergency Medicine

## 2010-08-01 ENCOUNTER — Emergency Department (HOSPITAL_COMMUNITY): Payer: Medicare Other

## 2010-08-01 DIAGNOSIS — E119 Type 2 diabetes mellitus without complications: Secondary | ICD-10-CM | POA: Insufficient documentation

## 2010-08-01 DIAGNOSIS — K2990 Gastroduodenitis, unspecified, without bleeding: Secondary | ICD-10-CM | POA: Insufficient documentation

## 2010-08-01 DIAGNOSIS — K2970 Gastritis, unspecified, without bleeding: Secondary | ICD-10-CM | POA: Insufficient documentation

## 2010-08-01 DIAGNOSIS — R1013 Epigastric pain: Secondary | ICD-10-CM | POA: Insufficient documentation

## 2010-08-01 LAB — URINALYSIS, ROUTINE W REFLEX MICROSCOPIC
Glucose, UA: NEGATIVE mg/dL
Hgb urine dipstick: NEGATIVE
Specific Gravity, Urine: 1.011 (ref 1.005–1.030)
pH: 7.5 (ref 5.0–8.0)

## 2010-08-01 LAB — COMPREHENSIVE METABOLIC PANEL
ALT: 13 U/L (ref 0–35)
Alkaline Phosphatase: 75 U/L (ref 39–117)
Glucose, Bld: 128 mg/dL — ABNORMAL HIGH (ref 70–99)
Potassium: 4.3 mEq/L (ref 3.5–5.1)
Sodium: 140 mEq/L (ref 135–145)
Total Protein: 7.3 g/dL (ref 6.0–8.3)

## 2010-08-01 LAB — DIFFERENTIAL
Basophils Absolute: 0 10*3/uL (ref 0.0–0.1)
Eosinophils Relative: 2 % (ref 0–5)
Lymphocytes Relative: 31 % (ref 12–46)
Neutro Abs: 2.2 10*3/uL (ref 1.7–7.7)
Neutrophils Relative %: 56 % (ref 43–77)

## 2010-08-01 LAB — LIPASE, BLOOD: Lipase: 32 U/L (ref 11–59)

## 2010-08-01 LAB — CBC
HCT: 32.4 % — ABNORMAL LOW (ref 36.0–46.0)
Hemoglobin: 10.2 g/dL — ABNORMAL LOW (ref 12.0–15.0)
RDW: 17.1 % — ABNORMAL HIGH (ref 11.5–15.5)
WBC: 4 10*3/uL (ref 4.0–10.5)

## 2010-08-11 ENCOUNTER — Encounter: Payer: Self-pay | Admitting: Endocrinology

## 2010-08-12 ENCOUNTER — Encounter: Payer: Self-pay | Admitting: Endocrinology

## 2010-08-12 ENCOUNTER — Ambulatory Visit (INDEPENDENT_AMBULATORY_CARE_PROVIDER_SITE_OTHER): Payer: Medicare Other | Admitting: Endocrinology

## 2010-08-12 VITALS — BP 120/60 | HR 99 | Temp 98.6°F | Ht 63.0 in | Wt 122.6 lb

## 2010-08-12 DIAGNOSIS — R110 Nausea: Secondary | ICD-10-CM

## 2010-08-12 NOTE — Progress Notes (Signed)
Subjective:    Patient ID: Susan Browning, female    DOB: 03/16/1921, 77 y.o.   MRN: [id number]  HPI Pt was seen in er 11 days ago for gi sxs.  She says these are resolved on zantac. i saw her here for nausea 3 mos ago.  She says sxs did not improve until she got ranitidine at the er.   Past Medical History  Diagnosis Date  . DIABETES MELLITUS, TYPE II 01/03/2007  . HYPERCHOLESTEROLEMIA 06/01/2009  . ANEMIA, IRON DEFICIENCY 08/15/2007  . LEUKOPENIA, CHRONIC 06/01/2009  . HYPERTENSION 01/03/2007  . ALLERGIC RHINITIS 01/03/2007  . OSTEOARTHRITIS 01/03/2007  . SHOULDER PAIN, LEFT 02/18/2008  . OSTEOPOROSIS 01/03/2007  . NONSPECIFIC ABNORMAL ELECTROCARDIOGRAM 02/12/2007  . COLONIC POLYPS, HX OF 01/03/2007  . NAUSEA 05/18/2010  . Diabetic retinopathy   . Smoker   . OA (osteoarthritis)     Past Surgical History  Procedure Date  . Child birth     x's 5  . Dilation and curettage of uterus     History   Social History  . Marital Status: Married    Spouse Name: N/A    Number of Children: N/A  . Years of Education: N/A   Occupational History  . Not on file.   Social History Main Topics  . Smoking status: Current Everyday Smoker -- 0.5 packs/day    Types: Cigarettes  . Smokeless tobacco: Not on file  . Alcohol Use: No  . Drug Use: No  . Sexually Active:    Other Topics Concern  . Not on file   Social History Narrative  . No narrative on file    Current Outpatient Prescriptions on File Prior to Visit  Medication Sig Dispense Refill  . ACTOS 45 MG tablet TAKE 1 TABLET EVERY DAY  30 tablet  5  . aspirin 81 MG tablet Take 81 mg by mouth daily.        . ferrous sulfate (IRON SUPPLEMENT) 325 (65 FE) MG tablet Take 325 mg by mouth 2 (two) times daily. Take 2 tabs twice a day       . glucose blood (ONE TOUCH ULTRA TEST) test strip 1 each by Other route as needed. Use as instructed       . lovastatin (MEVACOR) 40 MG tablet Take 40 mg by mouth at bedtime. Take 2 by mouth at bedtime        . oxyCODONE-acetaminophen (PERCOCET) 5-325 MG per tablet Take 1 tablet by mouth every 4 (four) hours as needed.        . ondansetron (ZOFRAN) 4 MG tablet Take 4 mg by mouth every 4 (four) hours as needed.          Allergies  Allergen Reactions  . Penicillins     Family History  Problem Relation Age of Onset  . Diabetes Sister   . Liver disease Daughter     Cirrhosis-transplant    BP 120/60  Pulse 99  Temp(Src) 98.6 F (37 C) (Oral)  Ht 5\' 3"  (1.6 m)  Wt 122 lb 9.6 oz (55.611 kg)  BMI 21.72 kg/m2  SpO2 94%    Review of Systems Denies weight loss.     Objective:   Physical Exam        Assessment & Plan:  Dyspeptic sxs.  In view of 3 mos duration, i favor continuing acid-suppressive rx. The other question is whether or not to advise egd.  This is a borderline calla at her age.  She is  ok either way.  Ref gi, to consider.

## 2010-08-12 NOTE — Patient Instructions (Addendum)
Change ranitidine to prilosec, 20 mg daily Refer to gastroenterology.  you will be called with a day and time for an appointment.

## 2010-08-15 ENCOUNTER — Telehealth: Payer: Self-pay | Admitting: Gastroenterology

## 2010-08-15 NOTE — Telephone Encounter (Signed)
Pt given an appt to see Willette Cluster NP 08/18/10@10am . Pt aware of appt date and time.

## 2010-08-18 ENCOUNTER — Ambulatory Visit (INDEPENDENT_AMBULATORY_CARE_PROVIDER_SITE_OTHER): Payer: Medicare Other | Admitting: Nurse Practitioner

## 2010-08-18 ENCOUNTER — Encounter: Payer: Self-pay | Admitting: Internal Medicine

## 2010-08-18 ENCOUNTER — Encounter: Payer: Self-pay | Admitting: Nurse Practitioner

## 2010-08-18 VITALS — BP 124/68 | HR 88 | Ht 63.0 in | Wt 120.0 lb

## 2010-08-18 DIAGNOSIS — K5900 Constipation, unspecified: Secondary | ICD-10-CM

## 2010-08-18 DIAGNOSIS — R1013 Epigastric pain: Secondary | ICD-10-CM | POA: Insufficient documentation

## 2010-08-18 DIAGNOSIS — D649 Anemia, unspecified: Secondary | ICD-10-CM

## 2010-08-18 MED ORDER — SUCRALFATE 1 GM/10ML PO SUSP: ORAL | Status: DC

## 2010-08-18 NOTE — Progress Notes (Signed)
08/18/2010 Susan Browning [id number] 12-09-1920   History of Present Illness:  Susan Browning is an 77 year old female who was seen in 2009 by Dr. Arlyce Dice for evaluation of anemia. An EGD done for evaluation of anemia was remarkable for a distal esophageal stricture and some superficial prepyloric ulcers. Her last colonoscopy was in 2007 and it was normal except for an ascending colon polyp. Patient called a few days ago with complaints of nausea. Patient gives a history of weight loss, amount unknown but pants are too big. Weight stable by our records. Appetite poor. Upon further questioning the patient doesn't seem to have any nausea at all. She complains of mid to upper abdominal burning / "stomach boiling", mainly at night when she lays down. Initially stated that the pain doesn't bother her during the day but then decided that she does in fact have some abdominal discomfort during the day. The discomfort is not related to meals. Though upon further questioning the patient really is not nauseated now, it should be noted that in February 2011 she did see her primary care physician with nausea, vomiting and diarrhea. She was diagnosed with gastroenteritis at that time. Susan Browning went to the emergency department late April for evaluation of the upper abdominal pain. Her CBC was remarkable with a hemoglobin of 10.2.  MCV was normal at 86.9. Platelets were normal. Her renal function and liver studies were normal. Her lipase was normal. Urinalysis unremarkable. Acute abdominal series showed scattered short segment air-fluid levels in the large and small bowel loops most consistent with an ileus. Patient was discharged home with instructions to take Zantac 150 mg twice daily, discontinue NSAIDS and take Maalox as needed. Susan Browning saw her PCP in follow up a  few days ago and was given Prilosec OTC which she is taking before meals. So far, she hasn't had much improvement. Patient also describes intermittent  solid food dysphagia but cannot elaborate. Patient is a poor historian.    Susan Browning has been taking MOM about once a week for constipation but her children have started her on Miralax..     Past Medical History  Diagnosis Date  . DIABETES MELLITUS, TYPE II 01/03/2007  . HYPERCHOLESTEROLEMIA 06/01/2009  . ANEMIA, IRON DEFICIENCY 08/15/2007  . LEUKOPENIA, CHRONIC 06/01/2009  . HYPERTENSION 01/03/2007  . ALLERGIC RHINITIS 01/03/2007  . OSTEOARTHRITIS 01/03/2007  . SHOULDER PAIN, LEFT 02/18/2008  . OSTEOPOROSIS 01/03/2007  . NONSPECIFIC ABNORMAL ELECTROCARDIOGRAM 02/12/2007  . COLONIC POLYPS, HX OF 01/03/2007  . NAUSEA 05/18/2010  . Diabetic retinopathy   . Smoker   . OA (osteoarthritis)    Past Surgical History  Procedure Date  . Child birth     x's 5  . Dilation and curettage of uterus     reports that she has been smoking Cigarettes.  She has been smoking about .5 packs per day. She does not have any smokeless tobacco history on file. She reports that she does not drink alcohol or use illicit drugs. family history includes Diabetes in her sister and Liver disease in her daughter.  There is no history of Colon cancer. Allergies  Allergen Reactions  . Penicillins      Outpatient Encounter Prescriptions as of 08/18/2010  Medication Sig Dispense Refill  . ACTOS 45 MG tablet TAKE 1 TABLET EVERY DAY  30 tablet  5  . aspirin 81 MG tablet Take 81 mg by mouth daily.        . ferrous sulfate (IRON SUPPLEMENT) 325 (65 FE)  MG tablet Take 325 mg by mouth 2 (two) times daily. Take 2 tabs twice a day       . glucose blood (ONE TOUCH ULTRA TEST) test strip 1 each by Other route as needed. Use as instructed       . lovastatin (MEVACOR) 40 MG tablet Take 40 mg by mouth at bedtime. Take 2 by mouth at bedtime       . omeprazole (PRILOSEC) 20 MG capsule Take 20 mg by mouth daily.        Marland Kitchen oxyCODONE-acetaminophen (PERCOCET) 5-325 MG per tablet Take 1 tablet by mouth every 4 (four) hours as needed.         . ondansetron (ZOFRAN) 4 MG tablet Take 4 mg by mouth every 4 (four) hours as needed.           ROS: All other systems reviewed and negative except where noted in the History of Present Illness.   Physical Exam: General: Well developed black female in no acute distress Head: Normocephalic and atraumatic Eyes:  sclerae anicteric,conjunctive pink. Ears: Normal auditory acuity Mouth: No deformity or lesions Neck: Supple, no masses.  Lungs: Clear throughout to auscultation Heart: Regular rate and rhythm; no murmurs heard Abdomen: Soft, non tender and non distended. No masses or hepatomegaly noted. Normal Bowel sounds Rectal: Not done Musculoskeletal: Symmetrical with no gross deformities  Skin: No lesions on visible extremities Extremities: No edema or deformities noted Neurological: Alert oriented x 4, grossly nonfocal Cervical Nodes:  No significant cervical adenopathy Psychological:  Alert and cooperative. Normal mood and affect  Assessment and Plan: 1.Epigastric pain Burning epigastric pain, ongoing for several weeks now. Labs in ER unremarkable. Abdominal series compatible with nonobstructive ileus. Patient on proton pump inhibitor for 3-4 days now without improvement. Prior to that patient tried Zantac and Mylanta without improvement.  Continue PPI, we'll have Carafate for the epigastric burning. Rule out recurrent peptic ulcer disease. For further evaluation patient will be scheduled for an upper endoscopy. The benefits, risks, and potential complications of EGD with possible biopsies and/or dilation were discussed with the patient and she agrees to proceed.   2. Intermittent solid food dysphagia. History of distal esophageal stricture 2004 and 2009. Rule out recurrent stricture. Patient may need repeat dilation at time of EGD.  3. Normocytic anemia, stable.  4. History of colon polyps.  Patient had a 15 mm tubulovillous adenomatous polyp removed August 2007. She is due for  surveillance examination this August.

## 2010-08-18 NOTE — Patient Instructions (Signed)
We have sent a prescription for the Carafate to your pharmacy [facility] [address]. We have scheduled the Endoscopy with Dr. Melvia Heaps on 08-29-2010. Directions and brochure provided.

## 2010-08-19 NOTE — Progress Notes (Signed)
Agree with initial assessment and plans 

## 2010-08-26 NOTE — Assessment & Plan Note (Signed)
[facility]                           GASTROENTEROLOGY OFFICE NOTE   MALIYA, MARICH                     MRN:          [id number]  DATE:11/30/2005                            DOB:          15-Jul-1920    PROBLEM:  History of colon polyps.   BRIEF HISTORY:  Mrs. Herbison is a pleasant 77 year old white female who has  returned to set up colonoscopy.  She has had adenomatous polyps in the past.  She also has a history of an esophageal stricture which was dilated.  She  denies dysphagia, change in bowel habits, or abdominal pain.  There is no  history of bleeding.   MEDICATIONS:  Include Metformin, lisinopril, lovastatin, Actonel, Actos, and  baby aspirin.   ALLERGIES:  To penicillin.   PHYSICAL EXAMINATION:  VITAL SIGNS:  Pulse 80, blood pressure 120/60, weight  121.  HEENT: EOMI. PERRLA. Sclerae are anicteric.  Conjunctivae are pink.  NECK:  Supple without thyromegaly, adenopathy or carotid bruits.  CHEST:  Clear to auscultation and percussion without adventitious sounds.  CARDIAC:  Regular rhythm; normal S1 S2.  There are no murmurs, gallops or  rubs.  ABDOMEN:  Bowel sounds are normoactive.  Abdomen is soft, non-tender and non-  distended.  There are no abdominal masses, tenderness, splenic enlargement  or hepatomegaly.  EXTREMITIES:  Full range of motion.  No cyanosis, clubbing or edema.  RECTAL:  Deferred.   IMPRESSION:  1. History of polyps.  2. Diabetes.  3. History of esophageal stricture, asymptomatic following dilatation      therapy.   RECOMMENDATIONS:  Follow up colonoscopy.                                   Barbette Hair. Arlyce Dice, MD, [facility]   RDK/MedQ  DD:  11/30/2005  DT:  11/30/2005  Job #:  [id number]

## 2010-08-29 ENCOUNTER — Other Ambulatory Visit: Payer: Self-pay | Admitting: Gastroenterology

## 2010-08-29 ENCOUNTER — Encounter: Payer: Self-pay | Admitting: Gastroenterology

## 2010-08-29 ENCOUNTER — Ambulatory Visit (AMBULATORY_SURGERY_CENTER): Payer: Medicare Other | Admitting: Gastroenterology

## 2010-08-29 VITALS — BP 142/78 | HR 92 | Temp 98.3°F | Resp 16 | Ht 62.0 in | Wt 120.0 lb

## 2010-08-29 DIAGNOSIS — R109 Unspecified abdominal pain: Secondary | ICD-10-CM

## 2010-08-29 DIAGNOSIS — K222 Esophageal obstruction: Secondary | ICD-10-CM

## 2010-08-29 DIAGNOSIS — R1013 Epigastric pain: Secondary | ICD-10-CM

## 2010-08-29 DIAGNOSIS — R1310 Dysphagia, unspecified: Secondary | ICD-10-CM

## 2010-08-29 LAB — GLUCOSE, CAPILLARY: Glucose-Capillary: 88 mg/dL (ref 70–99)

## 2010-08-29 MED ORDER — SODIUM CHLORIDE 0.9 % IV SOLN: 500.0000 mL | INTRAVENOUS | Status: DC

## 2010-08-29 NOTE — Patient Instructions (Signed)
Dr Nita Sells office will arrange for you to have a gastric emptying scan- this will tell you how your stomach empties/ functions Call his office in the next 1-3 days at [phone] to schedule an appointment for one month

## 2010-08-30 ENCOUNTER — Telehealth: Payer: Self-pay | Admitting: *Deleted

## 2010-08-30 ENCOUNTER — Telehealth: Payer: Self-pay

## 2010-08-30 NOTE — Telephone Encounter (Signed)

## 2010-08-30 NOTE — Telephone Encounter (Signed)
Pt scheduled for a GES at [facility] 09/19/10, pt needs to arrive at 9:45am for 10am appt time. Pt knows to hold Prilosec 8 hours prior to scan. Pt aware of appt date and time.

## 2010-09-07 ENCOUNTER — Other Ambulatory Visit: Payer: Self-pay | Admitting: Endocrinology

## 2010-09-11 ENCOUNTER — Other Ambulatory Visit: Payer: Self-pay | Admitting: Endocrinology

## 2010-09-12 NOTE — Telephone Encounter (Signed)
Medication removed from med list 05/18/2010 (see EMR), ok to refill? Please advise.

## 2010-09-12 NOTE — Telephone Encounter (Signed)
Rx faxed to pharmacy  

## 2010-09-12 NOTE — Telephone Encounter (Signed)
Looks like it was stopped for an acute illness then.  Please resume.

## 2010-09-19 ENCOUNTER — Encounter (HOSPITAL_COMMUNITY)
Admission: RE | Admit: 2010-09-19 | Discharge: 2010-09-19 | Disposition: A | Discharge status: Home / Self Care | Payer: Medicare Other | Source: Ambulatory Visit | Attending: Gastroenterology | Admitting: Gastroenterology

## 2010-09-21 ENCOUNTER — Encounter (HOSPITAL_COMMUNITY): Payer: Self-pay

## 2010-09-21 ENCOUNTER — Encounter (HOSPITAL_COMMUNITY)
Admission: RE | Admit: 2010-09-21 | Discharge: 2010-09-21 | Disposition: A | Discharge status: Home / Self Care | Payer: Medicare Other | Source: Ambulatory Visit | Attending: Gastroenterology | Admitting: Gastroenterology

## 2010-09-21 DIAGNOSIS — R109 Unspecified abdominal pain: Secondary | ICD-10-CM | POA: Insufficient documentation

## 2010-09-21 MED ORDER — TECHNETIUM TC 99M SULFUR COLLOID
2.2000 | Freq: Once | INTRAVENOUS | Status: AC | PRN
  Administered 2010-09-21: 2.2 via ORAL

## 2010-09-22 ENCOUNTER — Telehealth: Payer: Self-pay

## 2010-09-22 NOTE — Telephone Encounter (Signed)
Pt aware of results per Dr. Kaplan 

## 2010-09-22 NOTE — Telephone Encounter (Signed)
Message copied by Michele Mcalpine on Thu Sep 22, 2010  9:49 AM ------      Message from: Melvia Heaps MD D      Created: Thu Sep 22, 2010  9:47 AM       Please inform pt that scan is normal

## 2010-10-07 ENCOUNTER — Ambulatory Visit: Payer: Medicare Other | Admitting: Gastroenterology

## 2011-01-03 ENCOUNTER — Encounter: Payer: Self-pay | Admitting: Endocrinology

## 2011-01-03 ENCOUNTER — Other Ambulatory Visit (INDEPENDENT_AMBULATORY_CARE_PROVIDER_SITE_OTHER): Payer: Medicare Other

## 2011-01-03 ENCOUNTER — Ambulatory Visit (INDEPENDENT_AMBULATORY_CARE_PROVIDER_SITE_OTHER): Payer: Medicare Other | Admitting: Endocrinology

## 2011-01-03 VITALS — BP 128/64 | HR 91 | Temp 98.0°F | Ht 63.0 in | Wt 123.8 lb

## 2011-01-03 DIAGNOSIS — M810 Age-related osteoporosis without current pathological fracture: Secondary | ICD-10-CM

## 2011-01-03 DIAGNOSIS — E7800 Pure hypercholesterolemia, unspecified: Secondary | ICD-10-CM

## 2011-01-03 DIAGNOSIS — I10 Essential (primary) hypertension: Secondary | ICD-10-CM

## 2011-01-03 DIAGNOSIS — Z79899 Other long term (current) drug therapy: Secondary | ICD-10-CM

## 2011-01-03 DIAGNOSIS — E119 Type 2 diabetes mellitus without complications: Secondary | ICD-10-CM

## 2011-01-03 DIAGNOSIS — Z86010 Personal history of colonic polyps: Secondary | ICD-10-CM

## 2011-01-03 DIAGNOSIS — M25519 Pain in unspecified shoulder: Secondary | ICD-10-CM

## 2011-01-03 DIAGNOSIS — D509 Iron deficiency anemia, unspecified: Secondary | ICD-10-CM

## 2011-01-03 DIAGNOSIS — Z23 Encounter for immunization: Secondary | ICD-10-CM

## 2011-01-03 LAB — CBC WITH DIFFERENTIAL/PLATELET
Basophils Relative: 0.9 % (ref 0.0–3.0)
HCT: 31.1 % — ABNORMAL LOW (ref 36.0–46.0)
Hemoglobin: 10.1 g/dL — ABNORMAL LOW (ref 12.0–15.0)
Lymphocytes Relative: 32.2 % (ref 12.0–46.0)
Lymphs Abs: 1.4 10*3/uL (ref 0.7–4.0)
MCHC: 32.7 g/dL (ref 30.0–36.0)
Monocytes Relative: 7.3 % (ref 3.0–12.0)
Neutro Abs: 2.6 10*3/uL (ref 1.4–7.7)
RBC: 3.49 Mil/uL — ABNORMAL LOW (ref 3.87–5.11)

## 2011-01-03 LAB — URINALYSIS, ROUTINE W REFLEX MICROSCOPIC
Ketones, ur: NEGATIVE
Specific Gravity, Urine: 1.02 (ref 1.000–1.030)
Total Protein, Urine: NEGATIVE
Urine Glucose: 100
pH: 7 (ref 5.0–8.0)

## 2011-01-03 LAB — HEPATIC FUNCTION PANEL
ALT: 11 U/L (ref 0–35)
AST: 21 U/L (ref 0–37)
Albumin: 3.7 g/dL (ref 3.5–5.2)
Alkaline Phosphatase: 76 U/L (ref 39–117)

## 2011-01-03 LAB — BASIC METABOLIC PANEL
CO2: 30 mEq/L (ref 19–32)
Calcium: 9.2 mg/dL (ref 8.4–10.5)
Chloride: 105 mEq/L (ref 96–112)
Potassium: 4.3 mEq/L (ref 3.5–5.1)
Sodium: 141 mEq/L (ref 135–145)

## 2011-01-03 LAB — IBC PANEL
Saturation Ratios: 7.8 % — ABNORMAL LOW (ref 20.0–50.0)
Transferrin: 357.9 mg/dL (ref 212.0–360.0)

## 2011-01-03 LAB — TSH: TSH: 1.48 u[IU]/mL (ref 0.35–5.50)

## 2011-01-03 LAB — MICROALBUMIN / CREATININE URINE RATIO: Microalb Creat Ratio: 1.8 mg/g (ref 0.0–30.0)

## 2011-01-03 LAB — LIPID PANEL
Cholesterol: 144 mg/dL (ref 0–200)
Total CHOL/HDL Ratio: 2
Triglycerides: 40 mg/dL (ref 0.0–149.0)

## 2011-01-03 MED ORDER — OXYCODONE HCL 5 MG PO TABS: ORAL_TABLET | ORAL | Status: DC

## 2011-01-03 NOTE — Patient Instructions (Addendum)
Here is a prescription for a stronger pain medication. Please schedule a regular physical soon.   blood tests are being requested for you today.  please call [phone] to hear your test results.  You will be prompted to enter the 9-digit "MRN" number that appears at the top left of this page, followed by #.  Then you will hear the message. (update: i left message on phone-tree:  Make sure you take fe tabs)

## 2011-01-03 NOTE — Progress Notes (Signed)
Subjective:    Patient ID: Susan Browning, female    DOB: [birth year], 77 y.o.   MRN: [id number]  HPI Pt states a few years of severe pain at the left shoulder, but no assoc numbness.   Denies brbpr and hematuria. Past Medical History  Diagnosis Date  . DIABETES MELLITUS, TYPE II 01/03/2007  . HYPERCHOLESTEROLEMIA 06/01/2009  . ANEMIA, IRON DEFICIENCY 08/15/2007  . LEUKOPENIA, CHRONIC 06/01/2009  . HYPERTENSION 01/03/2007  . ALLERGIC RHINITIS 01/03/2007  . OSTEOARTHRITIS 01/03/2007  . SHOULDER PAIN, LEFT 02/18/2008  . OSTEOPOROSIS 01/03/2007  . NONSPECIFIC ABNORMAL ELECTROCARDIOGRAM 02/12/2007  . COLONIC POLYPS, HX OF 01/03/2007  . Diabetic retinopathy   . Smoker   . OA (osteoarthritis)     Past Surgical History  Procedure Date  . Child birth     x's 5  . Dilation and curettage of uterus     History   Social History  . Marital Status: Married    Spouse Name: N/A    Number of Children: N/A  . Years of Education: N/A   Occupational History  . Not on file.   Social History Main Topics  . Smoking status: Current Everyday Smoker -- 0.5 packs/day    Types: Cigarettes  . Smokeless tobacco: Not on file  . Alcohol Use: No  . Drug Use: No  . Sexually Active:    Other Topics Concern  . Not on file   Social History Narrative  . No narrative on file    Current Outpatient Prescriptions on File Prior to Visit  Medication Sig Dispense Refill  . ACTOS 45 MG tablet TAKE 1 TABLET EVERY DAY  30 tablet  5  . aspirin 81 MG tablet Take 81 mg by mouth daily.        . ferrous sulfate (IRON SUPPLEMENT) 325 (65 FE) MG tablet Take 325 mg by mouth 2 (two) times daily. Take 2 tabs twice a day       . glucose blood (ONE TOUCH ULTRA TEST) test strip 1 each by Other route as needed. Use as instructed       . lovastatin (MEVACOR) 40 MG tablet TAKE 2 TABLETS AT BEDTIME  180 tablet  2  . metFORMIN (GLUCOPHAGE) 1000 MG tablet TAKE 1 TABLET TWICE DAILY  180 tablet  1  . sucralfate (CARAFATE) 1  GM/10ML suspension Before meals and at bedtime  420 mL  1  . omeprazole (PRILOSEC) 20 MG capsule Take 20 mg by mouth daily.        . ondansetron (ZOFRAN) 4 MG tablet Take 4 mg by mouth every 4 (four) hours as needed.         Current Facility-Administered Medications on File Prior to Visit  Medication Dose Route Frequency Provider Last Rate Last Dose  . 0.9 %  sodium chloride infusion  500 mL Intravenous Continuous Louis Meckel, MD        Allergies  Allergen Reactions  . Penicillins     Family History  Problem Relation Age of Onset  . Diabetes Sister   . Liver disease Daughter     Cirrhosis-transplant  . Colon cancer Neg Hx   . Breast cancer Maternal Aunt    BP 128/64  Pulse 91  Temp(Src) 98 F (36.7 C) (Oral)  Ht 5\' 3"  (1.6 m)  Wt 123 lb 12.8 oz (56.155 kg)  BMI 21.93 kg/m2  SpO2 95%  Review of Systems Denies weight change and sob.      Objective:  Physical Exam Vital signs: see vs page Gen: elderly, frail, no distress Left shoulder: full rom, but rom is painful Neuro: sensation is intact to touch on the lue. Pulses: left radial is intact   Lab Results  Component Value Date   WBC 4.4* 01/03/2011   HGB 10.1* 01/03/2011   HCT 31.1* 01/03/2011   PLT 205.0 01/03/2011   CHOL 144 01/03/2011   TRIG 40.0 01/03/2011   HDL 86.20 01/03/2011   ALT 11 01/03/2011   AST 21 01/03/2011   NA 141 01/03/2011   K 4.3 01/03/2011   CL 105 01/03/2011   CREATININE 0.5 01/03/2011   BUN 13 01/03/2011   CO2 30 01/03/2011   TSH 1.48 01/03/2011   HGBA1C 6.5 01/03/2011   MICROALBUR 1.3 01/03/2011      Assessment & Plan:  Left shoulder pain, needs increased rx.  She is not a surgical candidate fe-deficiency anemia, needs increased rx Dm, well-controlled Dyslipidemia, well-controlled

## 2011-02-07 ENCOUNTER — Ambulatory Visit (INDEPENDENT_AMBULATORY_CARE_PROVIDER_SITE_OTHER): Payer: Medicare Other | Admitting: Endocrinology

## 2011-02-07 ENCOUNTER — Encounter: Payer: Self-pay | Admitting: Endocrinology

## 2011-02-07 ENCOUNTER — Other Ambulatory Visit: Payer: Self-pay | Admitting: Endocrinology

## 2011-02-07 VITALS — BP 152/82 | HR 100 | Temp 97.9°F | Resp 16 | Ht 63.5 in | Wt 120.5 lb

## 2011-02-07 DIAGNOSIS — Z136 Encounter for screening for cardiovascular disorders: Secondary | ICD-10-CM

## 2011-02-07 DIAGNOSIS — M25519 Pain in unspecified shoulder: Secondary | ICD-10-CM

## 2011-02-07 MED ORDER — BUPROPION HCL ER (XL) 150 MG PO TB24: 150.0000 mg | ORAL_TABLET | Freq: Every day | ORAL | Status: DC

## 2011-02-07 NOTE — Telephone Encounter (Signed)
Done

## 2011-02-07 NOTE — Progress Notes (Signed)
Subjective:    Patient ID: Susan Browning, female    DOB: 05/03/1920, 77 y.o.   MRN: [id number]  HPI here for regular wellness examination.  she's feeling pretty well in general, and says chronic med probs are stable, except as noted below Past Medical History  Diagnosis Date  . DIABETES MELLITUS, TYPE II 01/03/2007  . HYPERCHOLESTEROLEMIA 06/01/2009  . ANEMIA, IRON DEFICIENCY 08/15/2007  . LEUKOPENIA, CHRONIC 06/01/2009  . HYPERTENSION 01/03/2007  . ALLERGIC RHINITIS 01/03/2007  . OSTEOARTHRITIS 01/03/2007  . SHOULDER PAIN, LEFT 02/18/2008  . OSTEOPOROSIS 01/03/2007  . NONSPECIFIC ABNORMAL ELECTROCARDIOGRAM 02/12/2007  . COLONIC POLYPS, HX OF 01/03/2007  . Diabetic retinopathy   . Smoker   . OA (osteoarthritis)     Past Surgical History  Procedure Date  . Child birth     x's 5  . Dilation and curettage of uterus     History   Social History  . Marital Status: Married    Spouse Name: N/A    Number of Children: N/A  . Years of Education: N/A   Occupational History  . Not on file.   Social History Main Topics  . Smoking status: Current Everyday Smoker -- 0.5 packs/day    Types: Cigarettes  . Smokeless tobacco: Not on file  . Alcohol Use: No  . Drug Use: No  . Sexually Active:    Other Topics Concern  . Not on file   Social History Narrative  . No narrative on file    Current Outpatient Prescriptions on File Prior to Visit  Medication Sig Dispense Refill  . ACTOS 45 MG tablet TAKE 1 TABLET EVERY DAY  30 tablet  5  . aspirin 81 MG tablet Take 81 mg by mouth daily.        . ferrous sulfate (IRON SUPPLEMENT) 325 (65 FE) MG tablet Take 325 mg by mouth 2 (two) times daily. Take 1 tab twice a day      . glucose blood (ONE TOUCH ULTRA TEST) test strip 1 each by Other route as needed. Use as instructed       . lovastatin (MEVACOR) 40 MG tablet TAKE 2 TABLETS AT BEDTIME  180 tablet  2  . omeprazole (PRILOSEC) 20 MG capsule Take 20 mg by mouth daily.        . ondansetron  (ZOFRAN) 4 MG tablet Take 4 mg by mouth every 4 (four) hours as needed.        Marland Kitchen oxyCODONE (OXY IR/ROXICODONE) 5 MG immediate release tablet 1 or 2 pills every 4 hours as needed for pain.  50 tablet  0  . sucralfate (CARAFATE) 1 GM/10ML suspension Before meals and at bedtime  420 mL  1   Current Facility-Administered Medications on File Prior to Visit  Medication Dose Route Frequency Provider Last Rate Last Dose  . 0.9 %  sodium chloride infusion  500 mL Intravenous Continuous Louis Meckel, MD        Allergies  Allergen Reactions  . Penicillins     Family History  Problem Relation Age of Onset  . Diabetes Sister   . Liver disease Daughter     Cirrhosis-transplant  . Colon cancer Neg Hx   . Breast cancer Maternal Aunt     BP 152/82  Pulse 100  Temp(Src) 97.9 F (36.6 C) (Oral)  Resp 16  Ht 5' 3.5" (1.613 m)  Wt 120 lb 8 oz (54.658 kg)  BMI 21.01 kg/m2  SpO2 95%  Review of Systems  Constitutional: Negative for fever and unexpected weight change.  HENT: Negative for hearing loss.   Eyes: Negative for visual disturbance.  Respiratory: Negative for cough and shortness of breath.   Cardiovascular: Negative for chest pain.  Gastrointestinal: Negative for anal bleeding.  Genitourinary: Negative for hematuria.  Musculoskeletal: Negative for back pain.  Skin:       Denies excessive diaphoresis  Neurological: Negative for syncope and headaches.  Hematological: Does not bruise/bleed easily.  Psychiatric/Behavioral:       She attributes insomnia to pain       Objective:   Physical Exam VS: see vs page GEN: no distress HEAD: head: no deformity eyes: no periorbital swelling, no proptosis external nose and ears are normal mouth: no lesion seen NECK: supple, thyroid is not enlarged CHEST WALL: no deformity LUNGS:  Clear to auscultation CV: reg rate and rhythm, no murmur ABD: abdomen is soft, nontender.  no hepatosplenomegaly.  not distended.  no  hernia MUSCULOSKELETAL: muscle bulk and strength are grossly normal.  no obvious joint swelling.  gait is normal and steady EXTEMITIES: no deformity.  no ulcer on the feet.  feet are of normal color and temp.  no edema PULSES: dorsalis pedis intact bilat.  no carotid bruit NEURO:  cn 2-12 grossly intact.   readily moves all 4's.  sensation is intact to touch on the feet SKIN:  Normal texture and temperature.  No rash or suspicious lesion is visible.   NODES:  None palpable at the neck PSYCH: alert, oriented x3.  Does not appear anxious nor depressed.      Assessment & Plan:  Wellness visit today, with problems stable, except as noted.  SEPARATE EVALUATION FOLLOWS--EACH PROBLEM HERE IS NEW, NOT RESPONDING TO TREATMENT, OR POSES SIGNIFICANT RISK TO THE PATIENT'S HEALTH: HISTORY OF THE PRESENT ILLNESS: Pt states a few years of moderate pain at the left shoulder, but no assoc numbness. Pt attributes depression to problems with her children. She says this makes it difficult for her to stop smoking. She does not take fe tabs. PAST MEDICAL HISTORY reviewed and up to date today. REVIEW OF SYSTEMS: Denies rash at the left arm PHYSICAL EXAMINATION: VITAL SIGNS:  See vs page GENERAL: no distress Left shoulder: full rom, but rom is painful.   LAB/XRAY RESULTS: Lab Results  Component Value Date   WBC 4.4* 01/03/2011   HGB 10.1* 01/03/2011   HCT 31.1* 01/03/2011   MCV 88.9 01/03/2011   PLT 205.0 01/03/2011  IMPRESSION: Depression, new.  prob has situational component.  She says this drives her smoking.   fe-deficiency anemia, needs increased rx Shoulder pain, new, uncertain etiology PLAN: See instruction page

## 2011-02-07 NOTE — Patient Instructions (Addendum)
You should take iron, 1 pill 2x a day Refer to an orthopedic specialist.  you will receive a phone call, about a day and time for an appointment. i have sent a prescription to your pharmacy, to help depression, and to help you quit smoking. please consider these measures for your health:  minimize alcohol.  do not use tobacco products.  have a colonoscopy at least every 10 years from age 75.  Women should have an annual mammogram from age 42.  keep firearms safely stored.  always use seat belts.  have working smoke alarms in your home.  see an eye doctor and dentist regularly.  never drive under the influence of alcohol or drugs (including prescription drugs).   please let me know what your wishes would be, if artificial life support measures should become necessary.  it is critically important to prevent falling down (keep floor areas well-lit, dry, and free of loose objects.  If you have a cane, walker, or wheelchair, you should use it, even for short trips around the house.  Also, try not to rush) Please come back for a follow-up appointment in 6 months. You should have a vaccine against shingles (a painful rash which results from the  chickenpox infection which most people had many years ago).  This vaccine reduces, but does not totally eliminate the risk of shingles.  Because this is a medicare part d benefit, there are 3 ways you can get it:  You can go to a pharmacy and get the injection (I can give you a prescription), or I can give you a prescription to have filled at a pharmacy, and bring back here for [place] to give.  The other option is that you can pay up-front for it, and we'll give you a form to make a claim for reimbursement from your medicare part d carrier.    Smoking Cessation This document explains the best ways for you to quit smoking and new treatments to help. It lists new medicines that can double or triple your chances of quitting and quitting for good. It also considers ways to avoid  relapses and concerns you may have about quitting, including weight gain. NICOTINE: A POWERFUL ADDICTION If you have tried to quit smoking, you know how hard it can be. It is hard because nicotine is a very addictive drug. For some people, it can be as addictive as heroin or cocaine. Usually, people make 2 or 3 tries, or more, before finally being able to quit. Each time you try to quit, you can learn about what helps and what hurts. Quitting takes hard work and a lot of effort, but you can quit smoking. QUITTING SMOKING IS ONE OF THE MOST IMPORTANT THINGS YOU WILL EVER DO.  You will live longer, feel better, and live better.     The impact on your body of quitting smoking is felt almost immediately:     Within 20 minutes, blood pressure decreases. Pulse returns to its normal level.     After 8 hours, carbon monoxide levels in the blood return to normal. Oxygen level increases.     After 24 hours, chance of heart attack starts to decrease. Breath, hair, and body stop smelling like smoke.     After 48 hours, damaged nerve endings begin to recover. Sense of taste and smell improve.     After 72 hours, the body is virtually free of nicotine. Bronchial tubes relax and breathing becomes easier.     After  2 to 12 weeks, lungs can hold more air. Exercise becomes easier and circulation improves.     Quitting will reduce your risk of having a heart attack, stroke, cancer, or lung disease:     After 1 year, the risk of coronary heart disease is cut in half.     After 5 years, the risk of stroke falls to the same as a nonsmoker.     After 10 years, the risk of lung cancer is cut in half and the risk of other cancers decreases significantly.     After 15 years, the risk of coronary heart disease drops, usually to the level of a nonsmoker.     If you are pregnant, quitting smoking will improve your chances of having a healthy baby.     The people you live with, especially your children, will be  healthier.     You will have extra money to spend on things other than cigarettes.  FIVE KEYS TO QUITTING Studies have shown that these 5 steps will help you quit smoking and quit for good. You have the best chances of quitting if you use them together: 1. Get ready.    2. Get support and encouragement.    3. Learn new skills and behaviors.    4. Get medicine to reduce your nicotine addiction and use it correctly.    5. Be prepared for relapse or difficult situations. Be determined to continue trying to quit, even if you do not succeed at first.  1. GET READY  Set a quit date.     Change your environment.     Get rid of ALL cigarettes, ashtrays, matches, and lighters in your home, car, and place of work.     Do not let people smoke in your home.     Review your past attempts to quit. Think about what worked and what did not.     Once you quit, do not smoke. NOT EVEN A PUFF!  2. GET SUPPORT AND ENCOURAGEMENT Studies have shown that you have a better chance of being successful if you have help. You can get support in many ways.  Tell your family, friends, and coworkers that you are going to quit and need their support. Ask them not to smoke around you.     Talk to your caregivers (doctor, dentist, nurse, pharmacist, psychologist, and/or smoking counselor).     Get individual, group, or telephone counseling and support. The more counseling you have, the better your chances are of quitting. Programs are available at [employer] and health centers. Call your local health department for information about programs in your area.     Spiritual beliefs and practices may help some smokers quit.     Quit meters are Photographer that keep track of quit statistics, such as amount of "quit-time," cigarettes not smoked, and money saved.     Many smokers find one or more of the many self-help books available useful in helping them quit and stay off tobacco.    3. LEARN NEW SKILLS AND BEHAVIORS  Try to distract yourself from urges to smoke. Talk to someone, go for a walk, or occupy your time with a task.     When you first try to quit, change your routine. Take a different route to work. Drink tea instead of coffee. Eat breakfast in a different place.     Do something to reduce your stress. Take a hot bath, exercise, or  read a book.     Plan something enjoyable to do every day. Reward yourself for not smoking.     Explore interactive web-based programs that specialize in helping you quit.  4. GET MEDICINE AND USE IT CORRECTLY Medicines can help you stop smoking and decrease the urge to smoke. Combining medicine with the above behavioral methods and support can quadruple your chances of successfully quitting smoking. The [place] Food and Drug Administration (FDA) has approved 7 medicines to help you quit smoking. These medicines fall into 3 categories.  Nicotine replacement therapy (delivers nicotine to your body without the negative effects and risks of smoking):     Nicotine gum: Available over-the-counter.     Nicotine lozenges: Available over-the-counter.     Nicotine inhaler: Available by prescription.     Nicotine nasal spray: Available by prescription.     Nicotine skin patches (transdermal): Available by prescription and over-the-counter.     Antidepressant medicine (helps people abstain from smoking, but how this works is unknown):     Bupropion sustained-release (SR) tablets: Available by prescription.     Nicotinic receptor partial agonist (simulates the effect of nicotine in your brain):     Varenicline tartrate tablets: Available by prescription.     Ask your caregiver for advice about which medicines to use and how to use them. Carefully read the information on the package.     Everyone who is trying to quit may benefit from using a medicine. If you are pregnant or trying to become pregnant, nursing an infant, you are under  age 59, or you smoke fewer than 10 cigarettes per day, talk to your caregiver before taking any nicotine replacement medicines.     You should stop using a nicotine replacement product and call your caregiver if you experience nausea, dizziness, weakness, vomiting, fast or irregular heartbeat, mouth problems with the lozenge or gum, or redness or swelling of the skin around the patch that does not go away.     Do not use any other product containing nicotine while using a nicotine replacement product.     Talk to your caregiver before using these products if you have diabetes, heart disease, asthma, stomach ulcers, you had a recent heart attack, you have high blood pressure that is not controlled with medicine, a history of irregular heartbeat, or you have been prescribed medicine to help you quit smoking.  5. BE PREPARED FOR RELAPSE OR DIFFICULT SITUATIONS  Most relapses occur within the first 3 months after quitting. Do not be discouraged if you start smoking again. Remember, most people try several times before they finally quit.     You may have symptoms of withdrawal because your body is used to nicotine. You may crave cigarettes, be irritable, feel very hungry, cough often, get headaches, or have difficulty concentrating.     The withdrawal symptoms are only temporary. They are strongest when you first quit, but they will go away within 10 to 14 days.  Here are some difficult situations to watch for:  Alcohol. Avoid drinking alcohol. Drinking lowers your chances of successfully quitting.     Caffeine. Try to reduce the amount of caffeine you consume. It also lowers your chances of successfully quitting.     Other smokers. Being around smoking can make you want to smoke. Avoid smokers.     Weight gain. Many smokers will gain weight when they quit, usually less than 10 pounds. Eat a healthy diet and stay active. Do not let  weight gain distract you from your main goal, quitting smoking. Some  medicines that help you quit smoking may also help delay weight gain. You can always lose the weight gained after you quit.     Bad mood or depression. There are a lot of ways to improve your mood other than smoking.  If you are having problems with any of these situations, talk to your caregiver. SPECIAL SITUATIONS AND CONDITIONS Studies suggest that everyone can quit smoking. Your situation or condition can give you a special reason to quit.  Pregnant women/new mothers: By quitting, you protect your baby's health and your own.     Hospitalized patients: By quitting, you reduce health problems and help healing.     Heart attack patients: By quitting, you reduce your risk of a second heart attack.     Lung, head, and neck cancer patients: By quitting, you reduce your chance of a second cancer.     Parents of children and adolescents: By quitting, you protect your children from illnesses caused by secondhand smoke.  QUESTIONS TO THINK ABOUT Think about the following questions before you try to stop smoking. You may want to talk about your answers with your caregiver.  Why do you want to quit?     If you tried to quit in the past, what helped and what did not?     What will be the most difficult situations for you after you quit? How will you plan to handle them?     Who can help you through the tough times? Your family? Friends? Caregiver?     What pleasures do you get from smoking? What ways can you still get pleasure if you quit?  Here are some questions to ask your caregiver:  How can you help me to be successful at quitting?     What medicine do you think would be best for me and how should I take it?     What should I do if I need more help?     What is smoking withdrawal like? How can I get information on withdrawal?  Quitting takes hard work and a lot of effort, but you can quit smoking. FOR MORE INFORMATION   Smokefree.gov (http://www.davis-sullivan.com/) provides free,  accurate, evidence-based information and professional assistance to help support the immediate and long-term needs of people trying to quit smoking. Document Released: 03/21/2001 Document Revised: 12/07/2010 Document Reviewed: 01/11/2009 [facility] Patient Information 2012 [place], [place].   (update: i left message on phone-tree:  Make sure you are taking fe 1-bid)

## 2011-03-09 ENCOUNTER — Encounter (HOSPITAL_COMMUNITY): Payer: Self-pay

## 2011-03-09 ENCOUNTER — Emergency Department (HOSPITAL_COMMUNITY): Payer: Medicare Other

## 2011-03-09 ENCOUNTER — Emergency Department (HOSPITAL_COMMUNITY)
Admission: EM | Admit: 2011-03-09 | Discharge: 2011-03-09 | Disposition: A | Discharge status: Home / Self Care | Payer: Medicare Other | Attending: Emergency Medicine | Admitting: Emergency Medicine

## 2011-03-09 DIAGNOSIS — F17200 Nicotine dependence, unspecified, uncomplicated: Secondary | ICD-10-CM | POA: Insufficient documentation

## 2011-03-09 DIAGNOSIS — E119 Type 2 diabetes mellitus without complications: Secondary | ICD-10-CM | POA: Insufficient documentation

## 2011-03-09 DIAGNOSIS — R42 Dizziness and giddiness: Secondary | ICD-10-CM | POA: Insufficient documentation

## 2011-03-09 DIAGNOSIS — M810 Age-related osteoporosis without current pathological fracture: Secondary | ICD-10-CM | POA: Insufficient documentation

## 2011-03-09 DIAGNOSIS — R059 Cough, unspecified: Secondary | ICD-10-CM | POA: Insufficient documentation

## 2011-03-09 DIAGNOSIS — I10 Essential (primary) hypertension: Secondary | ICD-10-CM | POA: Insufficient documentation

## 2011-03-09 DIAGNOSIS — R05 Cough: Secondary | ICD-10-CM | POA: Insufficient documentation

## 2011-03-09 DIAGNOSIS — E7800 Pure hypercholesterolemia, unspecified: Secondary | ICD-10-CM | POA: Insufficient documentation

## 2011-03-09 DIAGNOSIS — R5381 Other malaise: Secondary | ICD-10-CM | POA: Insufficient documentation

## 2011-03-09 DIAGNOSIS — R531 Weakness: Secondary | ICD-10-CM

## 2011-03-09 LAB — COMPREHENSIVE METABOLIC PANEL
ALT: 11 U/L (ref 0–35)
Alkaline Phosphatase: 93 U/L (ref 39–117)
CO2: 27 mEq/L (ref 19–32)
Chloride: 96 mEq/L (ref 96–112)
GFR calc Af Amer: >90 mL/min (ref >90)
GFR calc non Af Amer: 84 mL/min — ABNORMAL LOW (ref >90)
Glucose, Bld: 93 mg/dL (ref 70–99)
Potassium: 3.5 mEq/L (ref 3.5–5.1)
Sodium: 131 mEq/L — ABNORMAL LOW (ref 135–145)

## 2011-03-09 LAB — URINE MICROSCOPIC-ADD ON

## 2011-03-09 LAB — URINALYSIS, ROUTINE W REFLEX MICROSCOPIC
Glucose, UA: NEGATIVE mg/dL
Hgb urine dipstick: NEGATIVE
Ketones, ur: NEGATIVE mg/dL
Protein, ur: NEGATIVE mg/dL

## 2011-03-09 LAB — DIFFERENTIAL
Eosinophils Relative: 1 % (ref 0–5)
Lymphocytes Relative: 13 % (ref 12–46)
Lymphs Abs: 0.6 10*3/uL — ABNORMAL LOW (ref 0.7–4.0)
Neutrophils Relative %: 74 % (ref 43–77)

## 2011-03-09 LAB — CBC
MCV: 86.5 fL (ref 78.0–100.0)
Platelets: 186 10*3/uL (ref 150–400)
RBC: 3.47 MIL/uL — ABNORMAL LOW (ref 3.87–5.11)
WBC: 4.5 10*3/uL (ref 4.0–10.5)

## 2011-03-09 MED ORDER — SODIUM CHLORIDE 0.9 % IV SOLN
INTRAVENOUS | Status: DC
  Administered 2011-03-09: 08:00:00 via INTRAVENOUS

## 2011-03-09 NOTE — ED Notes (Signed)
Up ambulating in the halls walked 50 feet steady with support x1

## 2011-03-09 NOTE — ED Provider Notes (Signed)
History     CSN: [id number] Arrival date & time: 03/09/2011  5:29 AM   First MD Initiated Contact with Patient 03/09/11 [phone]      Chief Complaint  Patient presents with  . Cough  . Fall  . Dizziness    (Consider location/radiation/quality/duration/timing/severity/associated sxs/prior treatment) Patient is a 77 y.o. female presenting with fall and weakness.  Fall Pertinent negatives include no visual change, no fever, no nausea, no vomiting, no headaches and no loss of consciousness.  Weakness The primary symptoms include dizziness. Primary symptoms do not include headaches, syncope, loss of consciousness, altered mental status, seizures, visual change, focal weakness, fever, nausea or vomiting. The symptoms began 2 to 6 hours ago. The symptoms are unchanged. The neurological symptoms are diffuse. Context: She was at home today in the bathroom, felt weak and slipped to the floor.  The dizziness began today. The dizziness has been unchanged since its onset. Associated with: She was coughing, prior to the fall. Dizziness also occurs with weakness. Dizziness does not occur with nausea or vomiting.  Additional symptoms include weakness. Additional symptoms do not include neck stiffness, pain, photophobia or nystagmus. Associated symptoms comments: She has had anorexia for 3 days. Medical issues do not include seizures, cerebral vascular accident or alcohol use.    Past Medical History  Diagnosis Date  . DIABETES MELLITUS, TYPE II 01/03/2007  . HYPERCHOLESTEROLEMIA 06/01/2009  . ANEMIA, IRON DEFICIENCY 08/15/2007  . LEUKOPENIA, CHRONIC 06/01/2009  . HYPERTENSION 01/03/2007  . ALLERGIC RHINITIS 01/03/2007  . OSTEOARTHRITIS 01/03/2007  . SHOULDER PAIN, LEFT 02/18/2008  . OSTEOPOROSIS 01/03/2007  . NONSPECIFIC ABNORMAL ELECTROCARDIOGRAM 02/12/2007  . COLONIC POLYPS, HX OF 01/03/2007  . Diabetic retinopathy   . Smoker   . OA (osteoarthritis)     Past Surgical History  Procedure Date  . Child  birth     x's 5  . Dilation and curettage of uterus     Family History  Problem Relation Age of Onset  . Diabetes Sister   . Liver disease Daughter     Cirrhosis-transplant  . Colon cancer Neg Hx   . Breast cancer Maternal Aunt     History  Substance Use Topics  . Smoking status: Current Everyday Smoker -- 0.5 packs/day    Types: Cigarettes  . Smokeless tobacco: Not on file  . Alcohol Use: No    OB History    Grav Para Term Preterm Abortions TAB SAB Ect Mult Living                  Review of Systems  Constitutional: Negative for fever.  HENT: Negative for neck stiffness.   Eyes: Negative for photophobia.  Cardiovascular: Negative for syncope.  Gastrointestinal: Negative for nausea and vomiting.  Neurological: Positive for dizziness and weakness. Negative for focal weakness, seizures, loss of consciousness and headaches.  Psychiatric/Behavioral: Negative for altered mental status.  All other systems reviewed and are negative.    Allergies  Penicillins  Home Medications   Current Outpatient Rx  Name Route Sig Dispense Refill  . ACTOS 45 MG PO TABS  TAKE 1 TABLET EVERY DAY 30 tablet 5  . ASPIRIN 81 MG PO TABS Oral Take 81 mg by mouth daily.      Marland Kitchen FERROUS SULFATE 325 (65 FE) MG PO TABS Oral Take 325 mg by mouth 2 (two) times daily. Take 1 tab twice a day    . LOVASTATIN 40 MG PO TABS  TAKE 2 TABLETS AT BEDTIME 180 tablet 2  .  METFORMIN HCL 1000 MG PO TABS  TAKE 1 TABLET TWICE DAILY 180 tablet 1    BP 152/75  Pulse 90  Temp(Src) 99.9 F (37.7 C) (Rectal)  Resp 21  Wt 120 lb 9.5 oz (54.7 kg)  SpO2 98%  Physical Exam  Constitutional: She is oriented to person, place, and time. She appears well-developed and well-nourished.  HENT:  Head: Normocephalic and atraumatic.  Eyes: EOM are normal. Pupils are equal, round, and reactive to light.  Neck: Normal range of motion. Neck supple.  Cardiovascular: Normal rate and regular rhythm.   Pulmonary/Chest: Effort  normal. No respiratory distress. She has no wheezes. She has rales. She exhibits no tenderness.       Rhonchi left base  Abdominal: Soft. Bowel sounds are normal.  Musculoskeletal: Normal range of motion.  Neurological: She is alert and oriented to person, place, and time. No cranial nerve deficit. She exhibits normal muscle tone. Coordination normal.  Skin: Skin is warm and dry.  Psychiatric: She has a normal mood and affect. Her behavior is normal. Judgment and thought content normal.    ED Course  Procedures (including critical care time) Treated with IV fluids. Repeat vital signs are reassuring. Labs Reviewed  CBC - Abnormal; Notable for the following:    RBC 3.47 (*)    Hemoglobin 9.8 (*)    HCT 30.0 (*)    RDW 17.8 (*)    All other components within normal limits  DIFFERENTIAL - Abnormal; Notable for the following:    Lymphs Abs 0.6 (*)    All other components within normal limits  COMPREHENSIVE METABOLIC PANEL - Abnormal; Notable for the following:    Sodium 131 (*)    Albumin 3.3 (*)    Total Bilirubin 0.1 (*)    GFR calc non Af Amer 84 (*)    All other components within normal limits  URINALYSIS, ROUTINE W REFLEX MICROSCOPIC - Abnormal; Notable for the following:    Leukocytes, UA SMALL (*)    All other components within normal limits  URINE MICROSCOPIC-ADD ON - Abnormal; Notable for the following:    Squamous Epithelial / LPF MANY (*)    Bacteria, UA FEW (*)    All other components within normal limits  LACTIC ACID, PLASMA  PROCALCITONIN  CULTURE, BLOOD (ROUTINE X 2)  CULTURE, BLOOD (ROUTINE X 2)  URINE CULTURE   13:43  Reevaluation: Additional family members are with the patient at this time, including her husband. They state that the patient has ongoing decreased appetite due to to anorexia associated with the medications that she takes. Her symptoms are worse at night. She occasionally takes antacids with improvement. Seen a GI doctor for an unspecified  problem.  1. Weakness   2. Cough       MDM  Cough, without pneumonia. Possible urinary tract infection. Urine culture is pending. No apparent ACS, metabolic instability or stroke. Tach, gastritis or peptic ulcer disease as a cause of her decreased appetite. Recommended trial of antacids and follow up with her PCP for definitive management        Flint Melter, MD 03/09/11 1350

## 2011-03-09 NOTE — ED Notes (Signed)
Pt complains of being weak and sick feeling, husband states that shes been coughing and this morning she fell between the toilet and counter, pt states she feels real dizzy

## 2011-03-10 LAB — URINE CULTURE
Colony Count: NO GROWTH
Culture  Setup Time: 201211291408
Culture: NO GROWTH

## 2011-03-15 LAB — CULTURE, BLOOD (ROUTINE X 2)
Culture  Setup Time: 201211291054
Culture: NO GROWTH

## 2011-05-13 ENCOUNTER — Other Ambulatory Visit: Payer: Self-pay | Admitting: Endocrinology

## 2011-07-12 ENCOUNTER — Other Ambulatory Visit: Payer: Self-pay | Admitting: Endocrinology

## 2011-08-12 ENCOUNTER — Other Ambulatory Visit: Payer: Self-pay | Admitting: Endocrinology

## 2011-11-17 ENCOUNTER — Other Ambulatory Visit: Payer: Self-pay | Admitting: Endocrinology

## 2011-12-27 ENCOUNTER — Ambulatory Visit (INDEPENDENT_AMBULATORY_CARE_PROVIDER_SITE_OTHER): Payer: Medicare Other

## 2011-12-27 DIAGNOSIS — Z23 Encounter for immunization: Secondary | ICD-10-CM

## 2012-02-08 ENCOUNTER — Ambulatory Visit (INDEPENDENT_AMBULATORY_CARE_PROVIDER_SITE_OTHER): Payer: Medicare Other | Admitting: Endocrinology

## 2012-02-08 DIAGNOSIS — E7800 Pure hypercholesterolemia, unspecified: Secondary | ICD-10-CM

## 2012-02-08 DIAGNOSIS — D509 Iron deficiency anemia, unspecified: Secondary | ICD-10-CM

## 2012-02-08 DIAGNOSIS — E119 Type 2 diabetes mellitus without complications: Secondary | ICD-10-CM

## 2012-02-08 DIAGNOSIS — Z79899 Other long term (current) drug therapy: Secondary | ICD-10-CM

## 2012-02-08 LAB — BASIC METABOLIC PANEL
Chloride: 103 mEq/L (ref 96–112)
Potassium: 4.5 mEq/L (ref 3.5–5.3)

## 2012-02-08 LAB — CBC WITH DIFFERENTIAL/PLATELET
Eosinophils Absolute: 0 10*3/uL (ref 0.0–0.7)
Hemoglobin: 11.1 g/dL — ABNORMAL LOW (ref 12.0–15.0)
Lymphs Abs: 1.1 10*3/uL (ref 0.7–4.0)
MCH: 29.5 pg (ref 26.0–34.0)
Monocytes Relative: 7 % (ref 3–12)
Neutro Abs: 2.4 10*3/uL (ref 1.7–7.7)
Neutrophils Relative %: 61 % (ref 43–77)
RBC: 3.76 MIL/uL — ABNORMAL LOW (ref 3.87–5.11)

## 2012-02-08 LAB — HEPATIC FUNCTION PANEL
Indirect Bilirubin: 0.3 mg/dL (ref 0.0–0.9)
Total Bilirubin: 0.4 mg/dL (ref 0.3–1.2)
Total Protein: 6.8 g/dL (ref 6.0–8.3)

## 2012-02-08 LAB — LIPID PANEL: Cholesterol: 140 mg/dL (ref 0–200)

## 2012-02-08 LAB — IBC PANEL
TIBC: 451 ug/dL (ref 250–470)
UIBC: 377 ug/dL (ref 125–400)

## 2012-02-08 LAB — TSH: TSH: 1.386 u[IU]/mL (ref 0.350–4.500)

## 2012-02-08 LAB — HEMOGLOBIN A1C: Mean Plasma Glucose: 131 mg/dL — ABNORMAL HIGH (ref <117)

## 2012-02-08 MED ORDER — HYDROCODONE-ACETAMINOPHEN 10-325 MG PO TABS: 1.0000 | ORAL_TABLET | ORAL | Status: DC | PRN

## 2012-02-08 NOTE — Progress Notes (Signed)
Subjective:    Patient ID: Susan Browning, female    DOB: [birth year], 78 y.o.   MRN: [id number]  HPI here for regular wellness examination.  He's feeling pretty well in general, and says chronic med probs are stable, except as noted below Left shoulder pain persists. Past Medical History  Diagnosis Date  . DIABETES MELLITUS, TYPE II 01/03/2007  . HYPERCHOLESTEROLEMIA 06/01/2009  . ANEMIA, IRON DEFICIENCY 08/15/2007  . LEUKOPENIA, CHRONIC 06/01/2009  . HYPERTENSION 01/03/2007  . ALLERGIC RHINITIS 01/03/2007  . OSTEOARTHRITIS 01/03/2007  . SHOULDER PAIN, LEFT 02/18/2008  . OSTEOPOROSIS 01/03/2007  . NONSPECIFIC ABNORMAL ELECTROCARDIOGRAM 02/12/2007  . COLONIC POLYPS, HX OF 01/03/2007  . Diabetic retinopathy   . Smoker   . OA (osteoarthritis)     Past Surgical History  Procedure Date  . Child birth     x's 5  . Dilation and curettage of uterus     History   Social History  . Marital Status: Married    Spouse Name: N/A    Number of Children: N/A  . Years of Education: N/A   Occupational History  . Not on file.   Social History Main Topics  . Smoking status: Current Every Day Smoker -- 0.5 packs/day    Types: Cigarettes  . Smokeless tobacco: Not on file  . Alcohol Use: No  . Drug Use: No  . Sexually Active:    Other Topics Concern  . Not on file   Social History Narrative  . No narrative on file    Current Outpatient Prescriptions on File Prior to Visit  Medication Sig Dispense Refill  . ACTOS 45 MG tablet TAKE 1 TABLET EVERY DAY  30 tablet  5  . aspirin 81 MG tablet Take 81 mg by mouth daily.        . ferrous sulfate (IRON SUPPLEMENT) 325 (65 FE) MG tablet Take 325 mg by mouth 2 (two) times daily. Take 1 tab twice a day      . lovastatin (MEVACOR) 40 MG tablet TAKE 2 TABLETS AT BEDTIME  180 tablet  0  . metFORMIN (GLUCOPHAGE) 1000 MG tablet TAKE 1 TABLET TWICE DAILY  180 tablet  1    Allergies  Allergen Reactions  . Penicillins     Family History  Problem  Relation Age of Onset  . Diabetes Sister   . Liver disease Daughter     Cirrhosis-transplant  . Colon cancer Neg Hx   . Breast cancer Maternal Aunt     There were no vitals taken for this visit.    Review of Systems  Constitutional: Negative for fever and unexpected weight change.  HENT: Negative for hearing loss.   Eyes: Negative for visual disturbance.  Respiratory: Negative for shortness of breath.   Cardiovascular: Negative for chest pain.  Gastrointestinal: Negative for blood in stool.  Genitourinary: Negative for hematuria.  Musculoskeletal: Negative for back pain.  Skin: Negative for rash.  Neurological: Negative for syncope and numbness.  Hematological: Does not bruise/bleed easily.  Psychiatric/Behavioral: Negative for dysphoric mood.       Objective:   Physical Exam VS: see vs page GEN: no distress HEAD: head: no deformity eyes: no periorbital swelling, no proptosis external nose and ears are normal mouth: no lesion seen NECK: supple, thyroid is not enlarged CHEST WALL: no deformity LUNGS:  Clear to auscultation CV: reg rate and rhythm, no murmur ABD: abdomen is soft, nontender.  no hepatosplenomegaly.  not distended.  no hernia MUSCULOSKELETAL: muscle bulk and  strength are grossly normal.  no obvious joint swelling.  gait is normal and steady EXTEMITIES: no deformity.  no ulcer on the feet.  feet are of normal color and temp.  no edema PULSES: dorsalis pedis intact bilat.  no carotid bruit NEURO:  cn 2-12 grossly intact.   readily moves all 4's.  sensation is intact to touch on the feet SKIN:  Normal texture and temperature.  No rash or suspicious lesion is visible.   NODES:  None palpable at the neck PSYCH: alert, oriented x3.  Does not appear anxious nor depressed.     Assessment & Plan:  Wellness visit today, with problems stable, except as noted.

## 2012-02-08 NOTE — Patient Instructions (Addendum)
please consider these measures for your health:  minimize alcohol.  do not use tobacco products.  have a colonoscopy at least every 10 years from age 78.  Women should have an annual mammogram from age 37.  keep firearms safely stored.  always use seat belts.  have working smoke alarms in your home.  see an eye doctor and dentist regularly.  never drive under the influence of alcohol or drugs (including prescription drugs).  please let me know what your wishes would be, if artificial life support measures should become necessary.  it is critically important to prevent falling down (keep floor areas well-lit, dry, and free of loose objects.  If you have a cane, walker, or wheelchair, you should use it, even for short trips around the house.  Also, try not to rush). Please come back for a follow-up appointment in 6 months You should have a vaccine against shingles (a painful rash which results from the  chickenpox infection which most people had many years ago).  This vaccine reduces, but does not totally eliminate the risk of shingles.  Because this is a medicare part d benefit, you should get it at a pharmacy.

## 2012-02-09 LAB — URINALYSIS, ROUTINE W REFLEX MICROSCOPIC
Bilirubin Urine: NEGATIVE
Glucose, UA: NEGATIVE mg/dL
Hgb urine dipstick: NEGATIVE
Ketones, ur: NEGATIVE mg/dL
Leukocytes, UA: NEGATIVE
Nitrite: NEGATIVE
Protein, ur: NEGATIVE mg/dL
Specific Gravity, Urine: 1.017 (ref 1.005–1.030)
Urobilinogen, UA: 1 mg/dL (ref 0.0–1.0)
pH: 7 (ref 5.0–8.0)

## 2012-02-09 LAB — MICROALBUMIN / CREATININE URINE RATIO
Creatinine, Urine: 103.1 mg/dL
Microalb Creat Ratio: 12.3 mg/g (ref 0.0–30.0)

## 2012-02-20 ENCOUNTER — Encounter: Payer: Self-pay | Admitting: Endocrinology

## 2012-03-13 ENCOUNTER — Other Ambulatory Visit: Payer: Self-pay | Admitting: Endocrinology

## 2012-03-22 IMAGING — CR DG CHEST 1V PORT
1 series · 1 of 1 positions shown · non-contrast
Comparison: Portable exam 4744 hours without priors for comparison

CLINICAL DATA: Cough, congestion, shortness of breath,
hypertension, smoker

PORTABLE CHEST - 1 VIEW

[view not recorded]
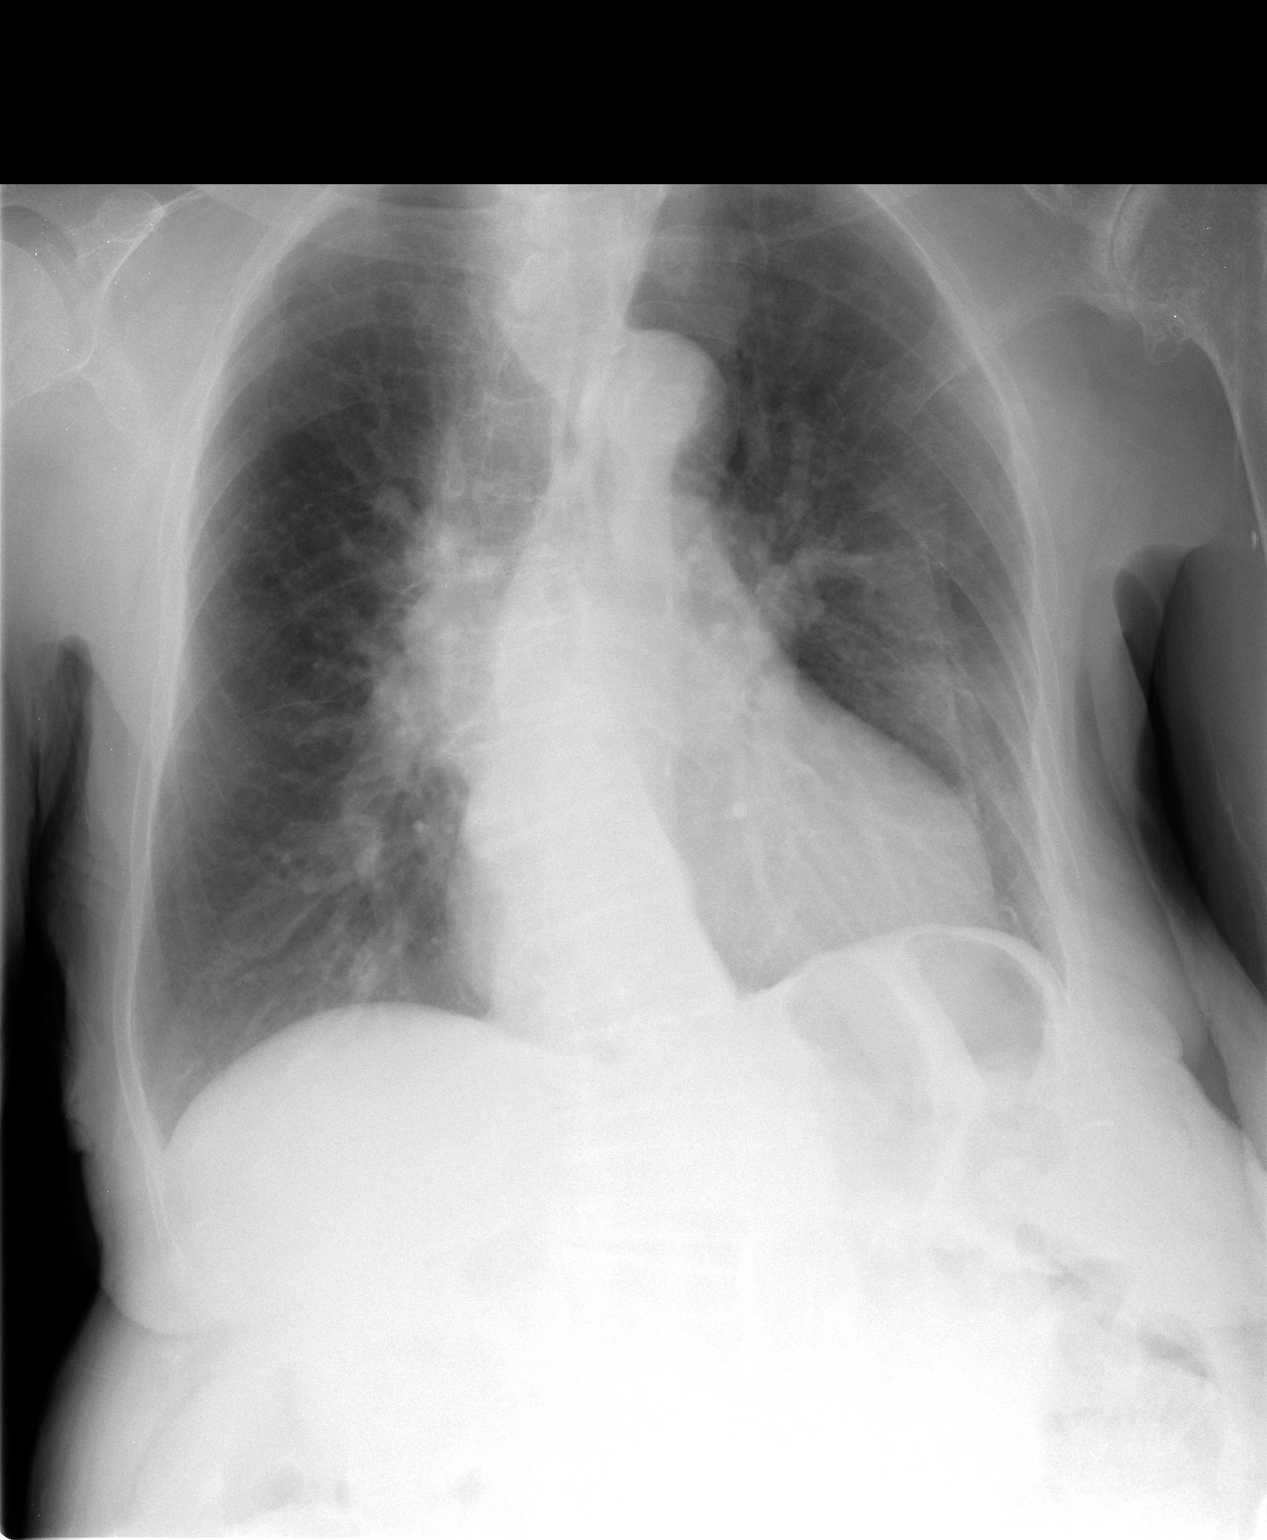

[1 of 1 positions shown; findings below may reference images not displayed]

FINDINGS: Biconvex thoracolumbar scoliosis.
Enlargement of cardiac silhouette.
Pulmonary vascular congestion.
Mildly tortuous aorta.
Emphysematous changes without infiltrate or effusion.
No pneumothorax.
Skin folds project over left chest.
Diffuse osseous demineralization.
Advanced left glenohumeral degenerative changes.
IMPRESSION: Enlargement of cardiac silhouette with pulmonary vascular
congestion.
Emphysematous changes.
No definite acute abnormalities.

## 2012-04-22 ENCOUNTER — Other Ambulatory Visit: Payer: Self-pay

## 2012-04-22 MED ORDER — PIOGLITAZONE HCL 45 MG PO TABS: 45.0000 mg | ORAL_TABLET | Freq: Every day | ORAL | Status: DC

## 2012-05-16 ENCOUNTER — Other Ambulatory Visit: Payer: Self-pay

## 2012-05-16 MED ORDER — METFORMIN HCL 1000 MG PO TABS: 1000.0000 mg | ORAL_TABLET | Freq: Two times a day (BID) | ORAL | Status: DC

## 2012-05-20 ENCOUNTER — Other Ambulatory Visit: Payer: Self-pay | Admitting: *Deleted

## 2012-05-20 ENCOUNTER — Other Ambulatory Visit: Payer: Self-pay

## 2012-05-20 MED ORDER — LOVASTATIN 40 MG PO TABS: ORAL_TABLET | ORAL | Status: DC

## 2012-05-20 MED ORDER — PIOGLITAZONE HCL 45 MG PO TABS: 45.0000 mg | ORAL_TABLET | Freq: Every day | ORAL | Status: DC

## 2012-09-30 ENCOUNTER — Other Ambulatory Visit: Payer: Self-pay | Admitting: *Deleted

## 2012-09-30 MED ORDER — LOVASTATIN 40 MG PO TABS: ORAL_TABLET | ORAL | Status: DC

## 2012-11-28 ENCOUNTER — Other Ambulatory Visit: Payer: Self-pay

## 2012-11-28 MED ORDER — METFORMIN HCL 1000 MG PO TABS: 1000.0000 mg | ORAL_TABLET | Freq: Two times a day (BID) | ORAL | Status: DC

## 2012-12-23 ENCOUNTER — Ambulatory Visit (INDEPENDENT_AMBULATORY_CARE_PROVIDER_SITE_OTHER): Payer: Medicare Other | Admitting: Endocrinology

## 2012-12-23 ENCOUNTER — Encounter: Payer: Self-pay | Admitting: Endocrinology

## 2012-12-23 VITALS — BP 136/80 | HR 78 | Ht 64.0 in | Wt 112.0 lb

## 2012-12-23 DIAGNOSIS — Z0000 Encounter for general adult medical examination without abnormal findings: Secondary | ICD-10-CM

## 2012-12-23 DIAGNOSIS — Z23 Encounter for immunization: Secondary | ICD-10-CM

## 2012-12-23 DIAGNOSIS — D509 Iron deficiency anemia, unspecified: Secondary | ICD-10-CM

## 2012-12-23 DIAGNOSIS — E119 Type 2 diabetes mellitus without complications: Secondary | ICD-10-CM

## 2012-12-23 LAB — CBC WITH DIFFERENTIAL/PLATELET
Basophils Relative: 0.7 % (ref 0.0–3.0)
Eosinophils Absolute: 0.1 10*3/uL (ref 0.0–0.7)
Lymphocytes Relative: 31.3 % (ref 12.0–46.0)
MCHC: 33.1 g/dL (ref 30.0–36.0)
Neutrophils Relative %: 59.7 % (ref 43.0–77.0)
Platelets: 190 10*3/uL (ref 150.0–400.0)
RBC: 3.69 Mil/uL — ABNORMAL LOW (ref 3.87–5.11)
WBC: 3.7 10*3/uL — ABNORMAL LOW (ref 4.5–10.5)

## 2012-12-23 LAB — IBC PANEL
Iron: 87 ug/dL (ref 42–145)
Saturation Ratios: 18.4 % — ABNORMAL LOW (ref 20.0–50.0)
Transferrin: 337.4 mg/dL (ref 212.0–360.0)

## 2012-12-23 MED ORDER — DICLOFENAC SODIUM 1 % TD GEL: 2.0000 g | Freq: Four times a day (QID) | TRANSDERMAL | Status: DC

## 2012-12-23 NOTE — Progress Notes (Signed)
Subjective:    Patient ID: Susan Browning, female    DOB: 1920-04-14, 79 y.o.   MRN: [id number]  HPI Pt states few mos of slight pain at the left knee.  No assoc numbness Denies brbpr Denies weight change Past Medical History  Diagnosis Date  . DIABETES MELLITUS, TYPE II 01/03/2007  . HYPERCHOLESTEROLEMIA 06/01/2009  . ANEMIA, IRON DEFICIENCY 08/15/2007  . LEUKOPENIA, CHRONIC 06/01/2009  . HYPERTENSION 01/03/2007  . ALLERGIC RHINITIS 01/03/2007  . OSTEOARTHRITIS 01/03/2007  . SHOULDER PAIN, LEFT 02/18/2008  . OSTEOPOROSIS 01/03/2007  . NONSPECIFIC ABNORMAL ELECTROCARDIOGRAM 02/12/2007  . COLONIC POLYPS, HX OF 01/03/2007  . Diabetic retinopathy   . Smoker   . OA (osteoarthritis)     Past Surgical History  Procedure Laterality Date  . Child birth      x's 5  . Dilation and curettage of uterus      History   Social History  . Marital Status: Married    Spouse Name: N/A    Number of Children: N/A  . Years of Education: N/A   Occupational History  . Not on file.   Social History Main Topics  . Smoking status: Current Every Day Smoker -- 0.50 packs/day    Types: Cigarettes  . Smokeless tobacco: Not on file  . Alcohol Use: No  . Drug Use: No  . Sexual Activity:    Other Topics Concern  . Not on file   Social History Narrative  . No narrative on file    Current Outpatient Prescriptions on File Prior to Visit  Medication Sig Dispense Refill  . aspirin 81 MG tablet Take 81 mg by mouth daily.        . ferrous sulfate (IRON SUPPLEMENT) 325 (65 FE) MG tablet Take 325 mg by mouth 2 (two) times daily. Take 1 tab twice a day      . HYDROcodone-acetaminophen (NORCO) 10-325 MG per tablet Take 1 tablet by mouth every 4 (four) hours as needed for pain.  50 tablet  0  . lovastatin (MEVACOR) 40 MG tablet TAKE 2 TABLETS AT BEDTIME EVERY NITE.  180 tablet  0  . metFORMIN (GLUCOPHAGE) 1000 MG tablet Take 1 tablet (1,000 mg total) by mouth 2 (two) times daily with a meal.  180 tablet  1   . pioglitazone (ACTOS) 45 MG tablet Take 1 tablet (45 mg total) by mouth daily.  30 tablet  5   No current facility-administered medications on file prior to visit.    Allergies  Allergen Reactions  . Penicillins     Family History  Problem Relation Age of Onset  . Diabetes Sister   . Liver disease Daughter     Cirrhosis-transplant  . Colon cancer Neg Hx   . Breast cancer Maternal Aunt    BP 136/80  Pulse 78  Ht 5\' 4"  (1.626 m)  Wt 112 lb (50.803 kg)  BMI 19.22 kg/m2  SpO2 97%  Review of Systems Denies hematuria and cough    Objective:   Physical Exam Vital signs: see vs page Gen: elderly, frail, no distress.   Left knee: normal to my exam  Lab Results  Component Value Date   WBC 3.7* 12/23/2012   HGB 10.9* 12/23/2012   HCT 32.8* 12/23/2012   MCV 88.9 12/23/2012   PLT 190.0 12/23/2012   Lab Results  Component Value Date   HGBA1C 6.4 12/23/2012      Assessment & Plan:  fe-deficiency anemia: w/u not indicated at this advanced age.  She needs increased rx. Knee pain, new, uncertain etiology.  W/u of this not indicated, for the same reason. DM: well-controlled

## 2012-12-23 NOTE — Patient Instructions (Addendum)
Please come back for a regular physical after 02/07/13.   blood tests are being requested for you today.  We'll contact you with results. i have sent a prescription to your pharmacy, for a gel for your left knee.

## 2013-01-06 ENCOUNTER — Other Ambulatory Visit: Payer: Self-pay

## 2013-01-06 MED ORDER — LOVASTATIN 40 MG PO TABS: ORAL_TABLET | ORAL | Status: DC

## 2013-03-18 ENCOUNTER — Other Ambulatory Visit: Payer: Self-pay | Admitting: *Deleted

## 2013-03-18 MED ORDER — PIOGLITAZONE HCL 45 MG PO TABS: 45.0000 mg | ORAL_TABLET | Freq: Every day | ORAL | Status: DC

## 2013-04-08 ENCOUNTER — Other Ambulatory Visit: Payer: Self-pay | Admitting: *Deleted

## 2013-04-08 MED ORDER — LOVASTATIN 40 MG PO TABS: ORAL_TABLET | ORAL | Status: DC

## 2013-04-09 ENCOUNTER — Other Ambulatory Visit: Payer: Self-pay

## 2013-04-09 MED ORDER — LOVASTATIN 40 MG PO TABS: ORAL_TABLET | ORAL | Status: DC

## 2013-06-23 ENCOUNTER — Other Ambulatory Visit: Payer: Self-pay

## 2013-06-23 MED ORDER — METFORMIN HCL 1000 MG PO TABS: 1000.0000 mg | ORAL_TABLET | Freq: Two times a day (BID) | ORAL | Status: DC

## 2013-09-06 ENCOUNTER — Other Ambulatory Visit: Payer: Self-pay | Admitting: Endocrinology

## 2013-12-04 ENCOUNTER — Other Ambulatory Visit: Payer: Self-pay | Admitting: Endocrinology

## 2013-12-09 ENCOUNTER — Other Ambulatory Visit: Payer: Self-pay | Admitting: Endocrinology

## 2014-01-06 ENCOUNTER — Ambulatory Visit: Payer: Medicare Other

## 2014-01-06 ENCOUNTER — Ambulatory Visit (INDEPENDENT_AMBULATORY_CARE_PROVIDER_SITE_OTHER): Payer: Medicare Other

## 2014-01-06 DIAGNOSIS — Z23 Encounter for immunization: Secondary | ICD-10-CM

## 2014-02-04 ENCOUNTER — Other Ambulatory Visit: Payer: Self-pay | Admitting: Endocrinology

## 2014-04-05 ENCOUNTER — Other Ambulatory Visit: Payer: Self-pay | Admitting: Endocrinology

## 2014-04-06 ENCOUNTER — Other Ambulatory Visit: Payer: Self-pay | Admitting: *Deleted

## 2014-04-06 MED ORDER — LOVASTATIN 40 MG PO TABS: 80.0000 mg | ORAL_TABLET | Freq: Every day | ORAL | Status: DC

## 2014-04-28 ENCOUNTER — Ambulatory Visit (INDEPENDENT_AMBULATORY_CARE_PROVIDER_SITE_OTHER): Payer: Medicare Other | Admitting: Endocrinology

## 2014-04-28 ENCOUNTER — Encounter: Payer: Self-pay | Admitting: Endocrinology

## 2014-04-28 VITALS — BP 130/70 | HR 64 | Temp 97.7°F | Ht 64.0 in | Wt 111.0 lb

## 2014-04-28 DIAGNOSIS — R413 Other amnesia: Secondary | ICD-10-CM | POA: Diagnosis not present

## 2014-04-28 DIAGNOSIS — E7800 Pure hypercholesterolemia, unspecified: Secondary | ICD-10-CM

## 2014-04-28 DIAGNOSIS — Z23 Encounter for immunization: Secondary | ICD-10-CM | POA: Diagnosis not present

## 2014-04-28 DIAGNOSIS — Z0189 Encounter for other specified special examinations: Secondary | ICD-10-CM

## 2014-04-28 DIAGNOSIS — I10 Essential (primary) hypertension: Secondary | ICD-10-CM

## 2014-04-28 DIAGNOSIS — Z0000 Encounter for general adult medical examination without abnormal findings: Secondary | ICD-10-CM

## 2014-04-28 DIAGNOSIS — D509 Iron deficiency anemia, unspecified: Secondary | ICD-10-CM

## 2014-04-28 DIAGNOSIS — E119 Type 2 diabetes mellitus without complications: Secondary | ICD-10-CM

## 2014-04-28 MED ORDER — DONEPEZIL HCL 5 MG PO TABS: 5.0000 mg | ORAL_TABLET | Freq: Every day | ORAL | Status: DC

## 2014-04-28 MED ORDER — DICLOFENAC SODIUM 1 % TD GEL: 4.0000 g | Freq: Four times a day (QID) | TRANSDERMAL | Status: DC

## 2014-04-28 NOTE — Patient Instructions (Addendum)
blood tests are being requested for you today.  We'll let you know about the results.   Please come back for a regular physical appointment in 1 month.  i have sent 2 prescriptions to your pharmacy: for memory, and an anti-pain cream.   Please let me know if you want to see a specialist for the shoulder pain, or to have an x-ray.  please consider these measures for your health:  minimize alcohol.  do not use tobacco products.  have a colonoscopy at least every 10 years from age 72.  Women should have an annual mammogram from age 41.  keep firearms safely stored.  always use seat belts.  have working smoke alarms in your home.  see an eye doctor and dentist regularly.  never drive under the influence of alcohol or drugs (including prescription drugs).  please let me know what your wishes it is critically important to prevent falling down (keep floor areas well-lit, dry, and free of loose objects.  If you have a cane, walker, or wheelchair, you should use it, even for short trips around the house.  Also, try not to rush).   good diet and exercise habits significanly improve your health.  please let me know if you wish to be referred to a dietician.  high blood sugar is very risky to your health.  you should see an eye doctor and dentist every year.  It is very important to get all recommended vaccinations.

## 2014-04-28 NOTE — Progress Notes (Signed)
Pt requests DNR

## 2014-04-28 NOTE — Progress Notes (Signed)
Subjective:    Patient ID: Susan Browning, female    DOB: 1920-11-08, 81 y.o.   MRN: [id number]  HPI Pt states few years of moderate pain at the left shoulder, but no assoc numbness.   Past Medical History  Diagnosis Date  . DIABETES MELLITUS, TYPE II 01/03/2007  . HYPERCHOLESTEROLEMIA 06/01/2009  . ANEMIA, IRON DEFICIENCY 08/15/2007  . LEUKOPENIA, CHRONIC 06/01/2009  . HYPERTENSION 01/03/2007  . ALLERGIC RHINITIS 01/03/2007  . OSTEOARTHRITIS 01/03/2007  . SHOULDER PAIN, LEFT 02/18/2008  . OSTEOPOROSIS 01/03/2007  . NONSPECIFIC ABNORMAL ELECTROCARDIOGRAM 02/12/2007  . COLONIC POLYPS, HX OF 01/03/2007  . Diabetic retinopathy   . Smoker   . OA (osteoarthritis)     Past Surgical History  Procedure Laterality Date  . Child birth      x's 5  . Dilation and curettage of uterus      History   Social History  . Marital Status: Married    Spouse Name: N/A    Number of Children: N/A  . Years of Education: N/A   Occupational History  . Not on file.   Social History Main Topics  . Smoking status: Current Every Day Smoker -- 0.50 packs/day    Types: Cigarettes  . Smokeless tobacco: Not on file  . Alcohol Use: No  . Drug Use: No  . Sexual Activity: Not on file   Other Topics Concern  . Not on file   Social History Narrative    Current Outpatient Prescriptions on File Prior to Visit  Medication Sig Dispense Refill  . lovastatin (MEVACOR) 40 MG tablet Take 2 tablets (80 mg total) by mouth at bedtime. 60 tablet 0  . metFORMIN (GLUCOPHAGE) 1000 MG tablet TAKE 1 TABLET (1,000 MG TOTAL) BY MOUTH 2 (TWO) TIMES DAILY WITH A MEAL. 180 tablet 1  . pioglitazone (ACTOS) 45 MG tablet TAKE 1 TABLET (45 MG TOTAL) BY MOUTH DAILY. 30 tablet 5   No current facility-administered medications on file prior to visit.    Allergies  Allergen Reactions  . Penicillins     Family History  Problem Relation Age of Onset  . Diabetes Sister   . Liver disease Daughter     Cirrhosis-transplant    . Colon cancer Neg Hx   . Breast cancer Maternal Aunt     BP 130/70 mmHg  Pulse 64  Temp(Src) 97.7 F (36.5 C) (Oral)  Ht 5\' 4"  (1.626 m)  Wt 111 lb (50.349 kg)  BMI 19.04 kg/m2    Review of Systems Denies weight change and chest pain    Objective:   Physical Exam VITAL SIGNS:  See vs page GENERAL: no distress Left shoulder: full rom, but rom is painful Pulses: left radial is intact  LUE: normal temp. Neuro: sensation is intact to touch on the LUE Pulses: dorsalis pedis intact bilat.   MSK: no deformity of the feet CV: 1+ bilat leg edema.  Skin:  no ulcer on the feet.  normal color and temp on the feet. Neuro: sensation is intact to touch on the feet   Lab Results  Component Value Date   WBC 4.1 04/28/2014   HGB 10.0* 04/28/2014   HCT 31.1* 04/28/2014   PLT 260.0 04/28/2014   GLUCOSE 112* 04/28/2014   CHOL 131 04/28/2014   TRIG 36.0 04/28/2014   HDL 68.20 04/28/2014   LDLCALC 56 04/28/2014   ALT 9 04/28/2014   AST 20 04/28/2014   NA 139 04/28/2014   K 4.1 04/28/2014  CL 103 04/28/2014   CREATININE 0.73 04/28/2014   BUN 15 04/28/2014   CO2 32 04/28/2014   TSH 1.96 04/28/2014   HGBA1C 7.0* 04/28/2014   MICROALBUR 25.1* 04/28/2014       Assessment & Plan:  Anemia: mod exacerbation.  fe tab 1/day is advised DM: well-controlled.  Same rx Shoulder pain, recurrent  i have sent 2 prescriptions to your pharmacy: for memory, and an anti-pain cream.   Subjective:   Patient here for Medicare annual wellness visit and management of other chronic and acute problems.     Risk factors: advanced age    53 of Physicians Providing Medical Care to Patient:  See "snapshot"   Activities of Daily Living: In your present state of health, do you have any difficulty performing the following activities? (lives with husb and granddaughter) Preparing food and eating?: yes  Bathing yourself: No Getting dressed: No  Using the toilet: No  Moving around from place  to place: No  In the past year have you fallen or had a near fall?: No    Home Safety: Has smoke detector and wears seat belts. No firearms.   Diet and Exercise  Current exercise habits: as limited by health problems Dietary issues discussed: pt reports a healthy diet   Depression Screen  Q1: Over the past two weeks, have you felt down, depressed or hopeless? no  Q2: Over the past two weeks, have you felt little interest or pleasure in doing things? no   The following portions of the patient's history were reviewed and updated as appropriate: allergies, current medications, past family history, past medical history, past social history, past surgical history and problem list.   Review of Systems  Denies hearing loss, and visual loss Objective:   Vision:  Sees opthalmologist Hearing: grossly normal Body mass index:  See vs page Msk: pt slowly performs "get-up-and-go" from a sitting position Cognitive Impairment Assessment: cognition and judgment appear normal.  remembers 0/3 at 5 minutes.  excellent recall.  can easily read but when asked to write a sentence, she writes an incomplete sentence.  alert and oriented to self, place, and January only.    Assessment:   Medicare wellness utd on preventive parameters    Plan:   During the course of the visit the patient was educated and counseled about appropriate screening and preventive services including:        Fall prevention   Diabetes screening  Nutrition counseling   Vaccines / LABS Zostavax / Pneumococcal Vaccine  today   Patient Instructions (the written plan) was given to the patient.

## 2014-04-29 LAB — BASIC METABOLIC PANEL
BUN: 15 mg/dL (ref 6–23)
CHLORIDE: 103 meq/L (ref 96–112)
CO2: 32 mEq/L (ref 19–32)
Calcium: 9.2 mg/dL (ref 8.4–10.5)
Creatinine, Ser: 0.73 mg/dL (ref 0.40–1.20)
GFR: 96.04 mL/min (ref >60.00)
GLUCOSE: 112 mg/dL — AB (ref 70–99)
POTASSIUM: 4.1 meq/L (ref 3.5–5.1)
Sodium: 139 mEq/L (ref 135–145)

## 2014-04-29 LAB — URINALYSIS, ROUTINE W REFLEX MICROSCOPIC
Hgb urine dipstick: NEGATIVE
Ketones, ur: 15 — AB
LEUKOCYTES UA: NEGATIVE
Nitrite: NEGATIVE
PH: 6 (ref 5.0–8.0)
TOTAL PROTEIN, URINE-UPE24: 30 — AB
Urine Glucose: NEGATIVE
Urobilinogen, UA: 0.2 (ref 0.0–1.0)

## 2014-04-29 LAB — MICROALBUMIN / CREATININE URINE RATIO
Creatinine,U: 365.5 mg/dL
MICROALB UR: 25.1 mg/dL — AB (ref 0.0–1.9)
MICROALB/CREAT RATIO: 6.9 mg/g (ref 0.0–30.0)

## 2014-04-29 LAB — CBC WITH DIFFERENTIAL/PLATELET
BASOS ABS: 0 10*3/uL (ref 0.0–0.1)
Basophils Relative: 0.1 % (ref 0.0–3.0)
EOS PCT: 1.5 % (ref 0.0–5.0)
Eosinophils Absolute: 0.1 10*3/uL (ref 0.0–0.7)
HCT: 31.1 % — ABNORMAL LOW (ref 36.0–46.0)
HEMOGLOBIN: 10 g/dL — AB (ref 12.0–15.0)
Lymphocytes Relative: 28.8 % (ref 12.0–46.0)
Lymphs Abs: 1.2 10*3/uL (ref 0.7–4.0)
MCHC: 32.3 g/dL (ref 30.0–36.0)
MCV: 84 fl (ref 78.0–100.0)
MONO ABS: 0.3 10*3/uL (ref 0.1–1.0)
MONOS PCT: 8.2 % (ref 3.0–12.0)
NEUTROS PCT: 61.4 % (ref 43.0–77.0)
Neutro Abs: 2.5 10*3/uL (ref 1.4–7.7)
PLATELETS: 260 10*3/uL (ref 150.0–400.0)
RBC: 3.7 Mil/uL — AB (ref 3.87–5.11)
RDW: 19.9 % — ABNORMAL HIGH (ref 11.5–15.5)
WBC: 4.1 10*3/uL (ref 4.0–10.5)

## 2014-04-29 LAB — HEPATIC FUNCTION PANEL
ALBUMIN: 3.6 g/dL (ref 3.5–5.2)
ALK PHOS: 87 U/L (ref 39–117)
ALT: 9 U/L (ref 0–35)
AST: 20 U/L (ref 0–37)
BILIRUBIN DIRECT: 0.1 mg/dL (ref 0.0–0.3)
TOTAL PROTEIN: 6.9 g/dL (ref 6.0–8.3)
Total Bilirubin: 0.3 mg/dL (ref 0.2–1.2)

## 2014-04-29 LAB — LIPID PANEL
Cholesterol: 131 mg/dL (ref 0–200)
HDL: 68.2 mg/dL (ref >39.00)
LDL Cholesterol: 56 mg/dL (ref 0–99)
NonHDL: 62.8
Total CHOL/HDL Ratio: 2
Triglycerides: 36 mg/dL (ref 0.0–149.0)
VLDL: 7.2 mg/dL (ref 0.0–40.0)

## 2014-04-29 LAB — VITAMIN B12: Vitamin B-12: 272 pg/mL (ref 211–911)

## 2014-04-29 LAB — IBC PANEL
Iron: 35 ug/dL — ABNORMAL LOW (ref 42–145)
Saturation Ratios: 7.3 % — ABNORMAL LOW (ref 20.0–50.0)
TRANSFERRIN: 343 mg/dL (ref 212.0–360.0)

## 2014-04-29 LAB — HEMOGLOBIN A1C: HEMOGLOBIN A1C: 7 % — AB (ref 4.6–6.5)

## 2014-04-29 LAB — SEDIMENTATION RATE: Sed Rate: 33 mm/hr — ABNORMAL HIGH (ref 0–22)

## 2014-04-29 LAB — TSH: TSH: 1.96 u[IU]/mL (ref 0.35–4.50)

## 2014-05-02 ENCOUNTER — Other Ambulatory Visit: Payer: Self-pay | Admitting: Endocrinology

## 2014-05-05 ENCOUNTER — Telehealth: Payer: Self-pay

## 2014-05-05 NOTE — Telephone Encounter (Signed)
Susan Browning phone number [phone]

## 2014-05-17 ENCOUNTER — Other Ambulatory Visit: Payer: Self-pay | Admitting: Endocrinology

## 2014-05-21 ENCOUNTER — Telehealth: Payer: Self-pay | Admitting: Endocrinology

## 2014-05-21 NOTE — Telephone Encounter (Signed)
Patient daughter would like to talk to you concerning her parents, Susan Browning  Phone # [phone]

## 2014-05-22 NOTE — Telephone Encounter (Signed)
Requested call back from pt's daughter to discuss.

## 2014-05-26 NOTE — Telephone Encounter (Signed)
Contacted pt's daughter. She states on 05/20/2014 she noticed the pt's legs had gotten more swollen. Daughter stated she elevated her feet and did warm water soaks and the swelling went down. She wanted to know if a rx for a fluid pill should be given for the swelling. Daughter also wanted MD to be aware the appointment made for this month is going to have to be rescheduled due to her job.  Please advise, Thanks!

## 2014-05-26 NOTE — Telephone Encounter (Signed)
i need to see pt in order to safely address sxs

## 2014-05-26 NOTE — Telephone Encounter (Signed)
Pt's daughter advised of note below and voiced understanding. She will call back to schedule appointment.

## 2014-06-08 ENCOUNTER — Ambulatory Visit: Payer: Self-pay | Admitting: Endocrinology

## 2014-06-12 ENCOUNTER — Encounter (HOSPITAL_COMMUNITY): Payer: Self-pay | Admitting: Emergency Medicine

## 2014-06-12 ENCOUNTER — Telehealth: Payer: Self-pay | Admitting: Family Medicine

## 2014-06-12 ENCOUNTER — Ambulatory Visit
Admission: RE | Admit: 2014-06-12 | Discharge: 2014-06-12 | Disposition: A | Discharge status: Home / Self Care | Payer: Self-pay | Source: Ambulatory Visit | Attending: Endocrinology | Admitting: Endocrinology

## 2014-06-12 ENCOUNTER — Ambulatory Visit (INDEPENDENT_AMBULATORY_CARE_PROVIDER_SITE_OTHER): Payer: Medicare Other | Admitting: Endocrinology

## 2014-06-12 ENCOUNTER — Encounter: Payer: Self-pay | Admitting: Endocrinology

## 2014-06-12 ENCOUNTER — Other Ambulatory Visit: Payer: Self-pay

## 2014-06-12 ENCOUNTER — Inpatient Hospital Stay (HOSPITAL_COMMUNITY)
Admission: EM | Admit: 2014-06-12 | Discharge: 2014-06-15 | DRG: 811 | Disposition: A | Discharge status: Home Health | Payer: Medicare Other | Attending: Internal Medicine | Admitting: Internal Medicine

## 2014-06-12 ENCOUNTER — Emergency Department (HOSPITAL_COMMUNITY): Payer: Medicare Other

## 2014-06-12 VITALS — BP 120/60 | HR 83 | Temp 98.2°F | Ht 64.0 in | Wt 115.0 lb

## 2014-06-12 DIAGNOSIS — R0600 Dyspnea, unspecified: Secondary | ICD-10-CM | POA: Diagnosis not present

## 2014-06-12 DIAGNOSIS — M810 Age-related osteoporosis without current pathological fracture: Secondary | ICD-10-CM | POA: Diagnosis not present

## 2014-06-12 DIAGNOSIS — E7800 Pure hypercholesterolemia, unspecified: Secondary | ICD-10-CM | POA: Diagnosis present

## 2014-06-12 DIAGNOSIS — D62 Acute posthemorrhagic anemia: Principal | ICD-10-CM | POA: Diagnosis present

## 2014-06-12 DIAGNOSIS — I447 Left bundle-branch block, unspecified: Secondary | ICD-10-CM | POA: Diagnosis not present

## 2014-06-12 DIAGNOSIS — J449 Chronic obstructive pulmonary disease, unspecified: Secondary | ICD-10-CM | POA: Diagnosis not present

## 2014-06-12 DIAGNOSIS — I5032 Chronic diastolic (congestive) heart failure: Secondary | ICD-10-CM | POA: Diagnosis not present

## 2014-06-12 DIAGNOSIS — D509 Iron deficiency anemia, unspecified: Secondary | ICD-10-CM

## 2014-06-12 DIAGNOSIS — R5383 Other fatigue: Secondary | ICD-10-CM | POA: Diagnosis not present

## 2014-06-12 DIAGNOSIS — J9601 Acute respiratory failure with hypoxia: Secondary | ICD-10-CM | POA: Diagnosis not present

## 2014-06-12 DIAGNOSIS — E46 Unspecified protein-calorie malnutrition: Secondary | ICD-10-CM

## 2014-06-12 DIAGNOSIS — R2689 Other abnormalities of gait and mobility: Secondary | ICD-10-CM | POA: Diagnosis not present

## 2014-06-12 DIAGNOSIS — E876 Hypokalemia: Secondary | ICD-10-CM | POA: Diagnosis not present

## 2014-06-12 DIAGNOSIS — E11319 Type 2 diabetes mellitus with unspecified diabetic retinopathy without macular edema: Secondary | ICD-10-CM | POA: Diagnosis not present

## 2014-06-12 DIAGNOSIS — F17210 Nicotine dependence, cigarettes, uncomplicated: Secondary | ICD-10-CM | POA: Diagnosis not present

## 2014-06-12 DIAGNOSIS — I10 Essential (primary) hypertension: Secondary | ICD-10-CM | POA: Diagnosis not present

## 2014-06-12 DIAGNOSIS — Z681 Body mass index (BMI) 19 or less, adult: Secondary | ICD-10-CM | POA: Diagnosis not present

## 2014-06-12 DIAGNOSIS — R0602 Shortness of breath: Secondary | ICD-10-CM | POA: Diagnosis not present

## 2014-06-12 DIAGNOSIS — E440 Moderate protein-calorie malnutrition: Secondary | ICD-10-CM | POA: Diagnosis not present

## 2014-06-12 DIAGNOSIS — R350 Frequency of micturition: Secondary | ICD-10-CM | POA: Diagnosis present

## 2014-06-12 DIAGNOSIS — I509 Heart failure, unspecified: Secondary | ICD-10-CM | POA: Diagnosis not present

## 2014-06-12 DIAGNOSIS — Z79899 Other long term (current) drug therapy: Secondary | ICD-10-CM

## 2014-06-12 DIAGNOSIS — D649 Anemia, unspecified: Secondary | ICD-10-CM | POA: Diagnosis present

## 2014-06-12 DIAGNOSIS — D3502 Benign neoplasm of left adrenal gland: Secondary | ICD-10-CM | POA: Diagnosis not present

## 2014-06-12 DIAGNOSIS — R55 Syncope and collapse: Secondary | ICD-10-CM | POA: Diagnosis not present

## 2014-06-12 DIAGNOSIS — I5043 Acute on chronic combined systolic (congestive) and diastolic (congestive) heart failure: Secondary | ICD-10-CM | POA: Diagnosis not present

## 2014-06-12 DIAGNOSIS — E119 Type 2 diabetes mellitus without complications: Secondary | ICD-10-CM

## 2014-06-12 DIAGNOSIS — R000 Tachycardia, unspecified: Secondary | ICD-10-CM | POA: Diagnosis present

## 2014-06-12 LAB — CBC WITH DIFFERENTIAL/PLATELET
Basophils Absolute: 0 10*3/uL (ref 0.0–0.1)
Basophils Relative: 0.2 % (ref 0.0–3.0)
EOS PCT: 1.7 % (ref 0.0–5.0)
Eosinophils Absolute: 0.1 10*3/uL (ref 0.0–0.7)
HCT: 19.7 % — CL (ref 36.0–46.0)
Hemoglobin: 6.1 g/dL — CL (ref 12.0–15.0)
Lymphocytes Relative: 39.4 % (ref 12.0–46.0)
Lymphs Abs: 1.6 10*3/uL (ref 0.7–4.0)
MCHC: 31.1 g/dL (ref 30.0–36.0)
MCV: 78.6 fl (ref 78.0–100.0)
Monocytes Absolute: 0.3 10*3/uL (ref 0.1–1.0)
Monocytes Relative: 7.7 % (ref 3.0–12.0)
NEUTROS PCT: 51 % (ref 43.0–77.0)
Neutro Abs: 2.1 10*3/uL (ref 1.4–7.7)
Platelets: 254 10*3/uL (ref 150.0–400.0)
RBC: 2.5 Mil/uL — ABNORMAL LOW (ref 3.87–5.11)
RDW: 21.1 % — ABNORMAL HIGH (ref 11.5–15.5)
WBC: 4.1 10*3/uL (ref 4.0–10.5)

## 2014-06-12 LAB — COMPREHENSIVE METABOLIC PANEL
ALT: 10 U/L (ref 0–35)
ANION GAP: 3 — AB (ref 5–15)
AST: 26 U/L (ref 0–37)
Albumin: 3.3 g/dL — ABNORMAL LOW (ref 3.5–5.2)
Alkaline Phosphatase: 63 U/L (ref 39–117)
BUN: 15 mg/dL (ref 6–23)
CALCIUM: 8.1 mg/dL — AB (ref 8.4–10.5)
CO2: 31 mmol/L (ref 19–32)
CREATININE: 0.56 mg/dL (ref 0.50–1.10)
Chloride: 104 mmol/L (ref 96–112)
GFR, EST NON AFRICAN AMERICAN: 79 mL/min — AB (ref >90)
GLUCOSE: 110 mg/dL — AB (ref 70–99)
Potassium: 4 mmol/L (ref 3.5–5.1)
Sodium: 138 mmol/L (ref 135–145)
TOTAL PROTEIN: 6.6 g/dL (ref 6.0–8.3)
Total Bilirubin: 0.4 mg/dL (ref 0.3–1.2)

## 2014-06-12 LAB — I-STAT CHEM 8, ED
BUN: 13 mg/dL (ref 6–23)
CALCIUM ION: 1.14 mmol/L (ref 1.13–1.30)
CHLORIDE: 101 mmol/L (ref 96–112)
CREATININE: 0.6 mg/dL (ref 0.50–1.10)
Glucose, Bld: 195 mg/dL — ABNORMAL HIGH (ref 70–99)
HCT: 23 % — ABNORMAL LOW (ref 36.0–46.0)
Hemoglobin: 7.8 g/dL — ABNORMAL LOW (ref 12.0–15.0)
Potassium: 3.9 mmol/L (ref 3.5–5.1)
Sodium: 141 mmol/L (ref 135–145)
TCO2: 24 mmol/L (ref 0–100)

## 2014-06-12 LAB — BRAIN NATRIURETIC PEPTIDE
B Natriuretic Peptide: 1133 pg/mL — ABNORMAL HIGH (ref 0.0–100.0)
Pro B Natriuretic peptide (BNP): 1395 pg/mL — ABNORMAL HIGH (ref 0.0–100.0)

## 2014-06-12 LAB — CBC
HCT: 19.6 % — ABNORMAL LOW (ref 36.0–46.0)
HEMOGLOBIN: 5.8 g/dL — AB (ref 12.0–15.0)
MCH: 24.3 pg — AB (ref 26.0–34.0)
MCHC: 29.6 g/dL — ABNORMAL LOW (ref 30.0–36.0)
MCV: 82 fL (ref 78.0–100.0)
PLATELETS: 219 10*3/uL (ref 150–400)
RBC: 2.39 MIL/uL — ABNORMAL LOW (ref 3.87–5.11)
RDW: 20.1 % — ABNORMAL HIGH (ref 11.5–15.5)
WBC: 4.5 10*3/uL (ref 4.0–10.5)

## 2014-06-12 LAB — PREPARE RBC (CROSSMATCH)

## 2014-06-12 LAB — IBC PANEL
Iron: 16 ug/dL — ABNORMAL LOW (ref 42–145)
Saturation Ratios: 3 % — ABNORMAL LOW (ref 20.0–50.0)
Transferrin: 381 mg/dL — ABNORMAL HIGH (ref 212.0–360.0)

## 2014-06-12 LAB — POC OCCULT BLOOD, ED: Fecal Occult Bld: NEGATIVE

## 2014-06-12 LAB — I-STAT TROPONIN, ED: TROPONIN I, POC: 0.01 ng/mL (ref 0.00–0.08)

## 2014-06-12 LAB — ABO/RH: ABO/RH(D): A POS

## 2014-06-12 MED ORDER — OXYCODONE HCL 5 MG PO TABS: 5.0000 mg | ORAL_TABLET | ORAL | Status: DC | PRN

## 2014-06-12 MED ORDER — ACETAMINOPHEN 325 MG PO TABS
650.0000 mg | ORAL_TABLET | Freq: Four times a day (QID) | ORAL | Status: DC | PRN
  Administered 2014-06-13 – 2014-06-15 (×3): 650 mg via ORAL
  Filled 2014-06-12 (×3): qty 2

## 2014-06-12 MED ORDER — SODIUM CHLORIDE 0.9 % IV SOLN
Freq: Once | INTRAVENOUS | Status: AC
  Administered 2014-06-13: 01:00:00 via INTRAVENOUS

## 2014-06-12 MED ORDER — ACETAMINOPHEN 650 MG RE SUPP: 650.0000 mg | Freq: Four times a day (QID) | RECTAL | Status: DC | PRN

## 2014-06-12 MED ORDER — HYDROMORPHONE HCL 1 MG/ML IJ SOLN: 0.5000 mg | INTRAMUSCULAR | Status: DC | PRN

## 2014-06-12 MED ORDER — DIPHENHYDRAMINE HCL 25 MG PO CAPS: 25.0000 mg | ORAL_CAPSULE | Freq: Once | ORAL | Status: DC

## 2014-06-12 MED ORDER — PANTOPRAZOLE SODIUM 40 MG PO TBEC
40.0000 mg | DELAYED_RELEASE_TABLET | Freq: Every day | ORAL | Status: DC
  Administered 2014-06-13 – 2014-06-15 (×3): 40 mg via ORAL
  Filled 2014-06-12 (×3): qty 1

## 2014-06-12 MED ORDER — PRAVASTATIN SODIUM 40 MG PO TABS
80.0000 mg | ORAL_TABLET | Freq: Every day | ORAL | Status: DC
  Administered 2014-06-13 – 2014-06-14 (×2): 80 mg via ORAL
  Filled 2014-06-12 (×2): qty 2

## 2014-06-12 MED ORDER — ONDANSETRON HCL 4 MG/2ML IJ SOLN: 4.0000 mg | Freq: Four times a day (QID) | INTRAMUSCULAR | Status: DC | PRN

## 2014-06-12 MED ORDER — SODIUM CHLORIDE 0.9 % IJ SOLN
3.0000 mL | Freq: Two times a day (BID) | INTRAMUSCULAR | Status: DC
  Administered 2014-06-13 – 2014-06-14 (×2): 3 mL via INTRAVENOUS

## 2014-06-12 MED ORDER — FUROSEMIDE 10 MG/ML IJ SOLN
20.0000 mg | INTRAMUSCULAR | Status: AC
  Administered 2014-06-13 (×2): 20 mg via INTRAVENOUS
  Filled 2014-06-12 (×2): qty 2

## 2014-06-12 MED ORDER — FUROSEMIDE 10 MG/ML IJ SOLN
20.0000 mg | Freq: Once | INTRAMUSCULAR | Status: AC
  Administered 2014-06-12: 20 mg via INTRAVENOUS
  Filled 2014-06-12: qty 4

## 2014-06-12 MED ORDER — SODIUM CHLORIDE 0.9 % IJ SOLN: 3.0000 mL | INTRAMUSCULAR | Status: DC | PRN

## 2014-06-12 MED ORDER — FUROSEMIDE 20 MG PO TABS
20.0000 mg | ORAL_TABLET | Freq: Every day | ORAL | Status: DC
  Administered 2014-06-13: 20 mg via ORAL
  Filled 2014-06-12: qty 1

## 2014-06-12 MED ORDER — ONDANSETRON HCL 4 MG PO TABS: 4.0000 mg | ORAL_TABLET | Freq: Four times a day (QID) | ORAL | Status: DC | PRN

## 2014-06-12 MED ORDER — FUROSEMIDE 20 MG PO TABS: 20.0000 mg | ORAL_TABLET | Freq: Every day | ORAL | Status: DC

## 2014-06-12 MED ORDER — DONEPEZIL HCL 5 MG PO TABS
5.0000 mg | ORAL_TABLET | Freq: Every day | ORAL | Status: DC
  Filled 2014-06-12 (×2): qty 1

## 2014-06-12 MED ORDER — ACETAMINOPHEN 325 MG PO TABS: 650.0000 mg | ORAL_TABLET | Freq: Once | ORAL | Status: DC

## 2014-06-12 MED ORDER — REPAGLINIDE 0.5 MG PO TABS: 0.5000 mg | ORAL_TABLET | Freq: Three times a day (TID) | ORAL | Status: DC

## 2014-06-12 MED ORDER — SODIUM CHLORIDE 0.9 % IV SOLN: Freq: Once | INTRAVENOUS | Status: AC

## 2014-06-12 MED ORDER — ALUM & MAG HYDROXIDE-SIMETH 200-200-20 MG/5ML PO SUSP: 30.0000 mL | Freq: Four times a day (QID) | ORAL | Status: DC | PRN

## 2014-06-12 MED ORDER — SODIUM CHLORIDE 0.9 % IV SOLN: 250.0000 mL | INTRAVENOUS | Status: DC | PRN

## 2014-06-12 NOTE — ED Notes (Signed)
EKG given to EDP,Wentz,MD., for review. 

## 2014-06-12 NOTE — ED Provider Notes (Signed)
CSN: [id number]     Arrival date & time 06/12/14  7026 History   First MD Initiated Contact with Patient 06/12/14 2053     Chief Complaint  Patient presents with  . abnormal labs      (Consider location/radiation/quality/duration/timing/severity/associated sxs/prior Treatment) Patient is a 81 y.o. female presenting with shortness of breath.  Shortness of Breath Severity:  Moderate Onset quality:  Sudden Progression:  Worsening Chronicity:  New Context: activity   Relieved by:  Rest Associated symptoms: no chest pain, no cough, no fever and no wheezing     Past Medical History  Diagnosis Date  . DIABETES MELLITUS, TYPE II 01/03/2007  . HYPERCHOLESTEROLEMIA 06/01/2009  . ANEMIA, IRON DEFICIENCY 08/15/2007  . LEUKOPENIA, CHRONIC 06/01/2009  . HYPERTENSION 01/03/2007  . ALLERGIC RHINITIS 01/03/2007  . OSTEOARTHRITIS 01/03/2007  . SHOULDER PAIN, LEFT 02/18/2008  . OSTEOPOROSIS 01/03/2007  . NONSPECIFIC ABNORMAL ELECTROCARDIOGRAM 02/12/2007  . COLONIC POLYPS, HX OF 01/03/2007  . Diabetic retinopathy   . Smoker   . OA (osteoarthritis)    Past Surgical History  Procedure Laterality Date  . Child birth      x's 5  . Dilation and curettage of uterus     Family History  Problem Relation Age of Onset  . Diabetes Sister   . Liver disease Daughter     Cirrhosis-transplant  . Colon cancer Neg Hx   . Breast cancer Maternal Aunt    History  Substance Use Topics  . Smoking status: Current Every Day Smoker -- 0.50 packs/day    Types: Cigarettes  . Smokeless tobacco: Not on file  . Alcohol Use: No   OB History    No data available     Review of Systems  Constitutional: Positive for fatigue. Negative for fever.  Respiratory: Positive for shortness of breath. Negative for cough and wheezing.   Cardiovascular: Negative for chest pain.  All other systems reviewed and are negative.     Allergies  Penicillins  Home Medications   Prior to Admission medications   Medication Sig  Start Date End Date Taking? Authorizing Provider  furosemide (LASIX) 20 MG tablet Take 1 tablet (20 mg total) by mouth daily. 06/12/14  Yes Renato Shin, MD  lovastatin (MEVACOR) 40 MG tablet TAKE 2 TABLETS (80 MG TOTAL) BY MOUTH AT BEDTIME. 05/04/14  Yes Renato Shin, MD  metFORMIN (GLUCOPHAGE) 1000 MG tablet TAKE 1 TABLET (1,000 MG TOTAL) BY MOUTH 2 (TWO) TIMES DAILY WITH A MEAL. 05/18/14  Yes Renato Shin, MD  diclofenac sodium (VOLTAREN) 1 % GEL Apply 4 g topically 4 (four) times daily. As needed for pain Patient not taking: Reported on 06/12/2014 04/28/14   Renato Shin, MD  donepezil (ARICEPT) 5 MG tablet Take 1 tablet (5 mg total) by mouth at bedtime. Patient not taking: Reported on 06/12/2014 04/28/14   Renato Shin, MD  repaglinide (PRANDIN) 0.5 MG tablet Take 1 tablet (0.5 mg total) by mouth 3 (three) times daily before meals. Patient not taking: Reported on 06/12/2014 06/12/14   Renato Shin, MD   BP 146/59 mmHg  Pulse 97  Temp(Src) 98.6 F (37 C) (Oral)  Resp 27  SpO2 100% Physical Exam  Constitutional: She is oriented to person, place, and time. She appears well-developed.  HENT:  Head: Normocephalic and atraumatic.  Eyes: Pupils are equal, round, and reactive to light.  Neck: Neck supple.  Cardiovascular: Intact distal pulses.   Pulmonary/Chest: She is in respiratory distress. She has rales.  Abdominal: Soft.  Musculoskeletal: She  exhibits edema. She exhibits no tenderness.  Lymphadenopathy:    She has no cervical adenopathy.  Neurological: She is alert and oriented to person, place, and time.  Skin: Skin is warm and dry.  Psychiatric: She has a normal mood and affect.  Nursing note and vitals reviewed.   ED Course  Procedures (including critical care time) Labs Review Labs Reviewed  I-STAT CHEM 8, ED - Abnormal; Notable for the following:    Glucose, Bld 195 (*)    Hemoglobin 7.8 (*)    HCT 23.0 (*)    All other components within normal limits  BRAIN NATRIURETIC PEPTIDE     Imaging Review Dg Chest 2 View  06/12/2014   CLINICAL DATA:  Acute shortness of breath with fatigue and weakness in upper and lower extremities for 1-2 weeks, smoker for 75 years, arthritis LEFT shoulder, type 2 diabetes, hypercholesterolemia, hypertension  EXAM: CHEST  2 VIEW  COMPARISON:  03/09/2011  FINDINGS: Enlargement of cardiac silhouette with pulmonary vascular congestion.  Atherosclerotic calcification aorta.  Biconvex thoracolumbar scoliosis.  Lungs appear emphysematous with minimal atelectasis or scarring in LEFT mid lung.  No definite acute infiltrate, pleural effusion or pneumothorax.  Osseous demineralization with scattered degenerative changes of the thoracolumbar spine.  Advanced LEFT glenohumeral degenerative changes.  IMPRESSION: Enlargement of cardiac silhouette with pulmonary vascular congestion.  COPD changes with minimal atelectasis or scarring LEFT mid lung.  No acute abnormalities.   Electronically Signed   By: Lavonia Dana M.D.   On: 06/12/2014 16:51     EKG Interpretation   Date/Time:  Friday June 12 2014 20:49:06 EST Ventricular Rate:  105 PR Interval:  153 QRS Duration: 138 QT Interval:  346 QTC Calculation: 457 R Axis:   -49 Text Interpretation:  Sinus tachycardia Multiple premature complexes, vent   Left bundle branch block Inferior infarct, acute Baseline wander in  lead(s) V3 Since last tracing of earlier today No significant change was  found Confirmed by Eulis Foster  MD, ELLIOTT [phone]) on 06/12/2014 8:52:45 PM     Patient presents to ED with family after PCP contacted with concern for drop in hemoglobin and hematocrit and possible need for blood transfusion.  Patient is normally able to walk around home without shortness of breath, but has had increased dyspnea on exertion the last several days.  Patient became very dyspneic when ambulating to the bathroom in the ED, dropping oxygen saturation into 80's, and became mildly tachycardic.  Denies chest pain. Crackles  appreciated upon auscultation bilaterally.  Hemoccult negative.  Patient discussed with and seen by Dr. Eulis Foster.  Blood transfusion ordered. Patient admitted to hospitalist. MDM   Final diagnoses:  Dyspnea    Anemia.    Norman Herrlich, NP 06/13/14 6045  Richarda Blade, MD 06/13/14 [phone]

## 2014-06-12 NOTE — ED Notes (Signed)
Bed: WA04 Expected date:  Expected time:  Means of arrival:  Comments: Hold for T2

## 2014-06-12 NOTE — ED Provider Notes (Signed)
  Face-to-face evaluation   History: Patient presents for evaluation of known low hemoglobin. This was found during routine testing done for evaluation of lower extremity swelling. History of anemia, not on iron, and takes NSAIDs. Today she is weaker than usual.    Physical exam: Elderly female alert, cooperative, she is uncomfortable. Heart- regular rate and rhythm. Lungs tachypnea is present.   Medical screening examination/treatment/procedure(s) were conducted as a shared visit with non-physician practitioner(s) and myself.  I personally evaluated the patient during the encounter  Richarda Blade, MD 06/13/14 1416

## 2014-06-12 NOTE — ED Notes (Signed)
Pt. Ambulated to bathroom with assist. Pt. Assisted back to her room when she became short of breath. Pt. Pale. RNs called to room.

## 2014-06-12 NOTE — Progress Notes (Signed)
Subjective:    Patient ID: Susan Browning, female    DOB: 1920/05/12, 81 y.o.   MRN: [id number]  HPI Pt states few weeks of moderate swelling of the ankles, but no assoc pain. She does not take fe tabs.  She says cbg's are well-controlled Past Medical History  Diagnosis Date  . DIABETES MELLITUS, TYPE II 01/03/2007  . HYPERCHOLESTEROLEMIA 06/01/2009  . ANEMIA, IRON DEFICIENCY 08/15/2007  . LEUKOPENIA, CHRONIC 06/01/2009  . HYPERTENSION 01/03/2007  . ALLERGIC RHINITIS 01/03/2007  . OSTEOARTHRITIS 01/03/2007  . SHOULDER PAIN, LEFT 02/18/2008  . OSTEOPOROSIS 01/03/2007  . NONSPECIFIC ABNORMAL ELECTROCARDIOGRAM 02/12/2007  . COLONIC POLYPS, HX OF 01/03/2007  . Diabetic retinopathy   . Smoker   . OA (osteoarthritis)     Past Surgical History  Procedure Laterality Date  . Child birth      x's 5  . Dilation and curettage of uterus      History   Social History  . Marital Status: Married    Spouse Name: N/A  . Number of Children: N/A  . Years of Education: N/A   Occupational History  . Not on file.   Social History Main Topics  . Smoking status: Current Every Day Smoker -- 0.50 packs/day    Types: Cigarettes  . Smokeless tobacco: Not on file  . Alcohol Use: No  . Drug Use: No  . Sexual Activity: Not on file   Other Topics Concern  . Not on file   Social History Narrative    No current facility-administered medications on file prior to visit.   Current Outpatient Prescriptions on File Prior to Visit  Medication Sig Dispense Refill  . diclofenac sodium (VOLTAREN) 1 % GEL Apply 4 g topically 4 (four) times daily. As needed for pain (Patient not taking: Reported on 06/12/2014) 100 g 11  . donepezil (ARICEPT) 5 MG tablet Take 1 tablet (5 mg total) by mouth at bedtime. (Patient not taking: Reported on 06/12/2014) 30 tablet 11  . lovastatin (MEVACOR) 40 MG tablet TAKE 2 TABLETS (80 MG TOTAL) BY MOUTH AT BEDTIME. 60 tablet 0  . metFORMIN (GLUCOPHAGE) 1000 MG tablet TAKE 1 TABLET  (1,000 MG TOTAL) BY MOUTH 2 (TWO) TIMES DAILY WITH A MEAL. 180 tablet 1    Allergies  Allergen Reactions  . Penicillins     Family History  Problem Relation Age of Onset  . Diabetes Sister   . Liver disease Daughter     Cirrhosis-transplant  . Colon cancer Neg Hx   . Breast cancer Maternal Aunt     BP 120/60 mmHg  Pulse 83  Temp(Src) 98.2 F (36.8 C) (Oral)  Ht 5\' 4"  (1.626 m)  Wt 115 lb (52.164 kg)  BMI 19.73 kg/m2  SpO2 93%    Review of Systems She has doe and fatigue.     Objective:   Physical Exam VITAL SIGNS:  See vs page GENERAL: no distress LUNGS:  Clear to auscultation Ext: 2+ bilat leg edema Calf areas are not swollen or tender.  Lab Results  Component Value Date   HGBA1C 7.0* 04/28/2014      Assessment & Plan:  Edema, new, prob due to pioglitizone Anemia.  Not taking fe tabs DM: well-controlled.  He'll need replacement for pioglitizone--low-dosage for now, as it is long-acting med.  Patient is advised the following: Patient Instructions  blood tests and a chest-x-ray, are being requested for you today.  We'll let you know about the results. Please stop taking the pioglitizone  pill. i have sent a prescription to your pharmacy, for a substitute for the diabetes (repaglinide).  Please start taking this in 1 week. i have also sent a prescription to your pharmacy, for a fluid pill. Let's also check and "echo" (painless and easy heart test).  you will receive a phone call, about a day and time for an appointment Please come back for a follow-up appointment in 2-3 weeks.

## 2014-06-12 NOTE — ED Notes (Signed)
Pt up to the bathroom with staff. And became short of breath and upon return to room pt's sats were 88% on Rm Air and pt was in moderate distress. Non-Rebreather applied and pt was settled back onto the stretcher. Sats improved to 100% on a non-rebreather. EKG done as well at this time and shown to MD

## 2014-06-12 NOTE — Patient Instructions (Addendum)
blood tests and a chest-x-ray, are being requested for you today.  We'll let you know about the results. Please stop taking the pioglitizone pill. i have sent a prescription to your pharmacy, for a substitute for the diabetes (repaglinide).  Please start taking this in 1 week. i have also sent a prescription to your pharmacy, for a fluid pill. Let's also check and "echo" (painless and easy heart test).  you will receive a phone call, about a day and time for an appointment Please come back for a follow-up appointment in 2-3 weeks.

## 2014-06-12 NOTE — ED Notes (Addendum)
Pt from home c/o of weakness. Her PCP called her and told her to come here for hemoglobin of 6.1

## 2014-06-12 NOTE — Telephone Encounter (Signed)
Received a phone call from [facility]. The patient's hgb has dropped to 6.1 from 10.0 on 04/28/2014. Unable to get patient on phone but was able to reach her daughter Marliss Coots. She agrees to take her to [facility] hospital tonight for evaluation. Was at patient's home when we spoke. She is aware that patient may require a transfusion due to sudden drop.

## 2014-06-12 NOTE — H&P (Addendum)
[facility] Admission History and Physical       Susan Browning KGY:[id number] DOB: 1920/12/16 DOA: 06/12/2014  Referring physician: EDP PCP: Renato Shin, MD  Specialists:   Chief Complaint: SOB, Fatigue, and Weakness  HPI: Susan Browning is a 81 y.o. female with a history of HTN, DM2, Hyperlipemia and CHF who presents to the ED with complaints of increasing  Weakness, fatrigue and sob over a 2 week period.   She denies any nausea or vomiting or diarrhea, or ABD Pain or chest Pain, also denies any hematemesis, or hematochezia, or melena.   She was found to have a hemoglobin of 5.8 in the ED.    Seen by PCP today due to increased edema of BLEs x weeks, had 2D Echo today.     Review of Systems:  Constitutional: No Weight Loss, No Weight Gain, Night Sweats, Fevers, Chills, Dizziness, Light Headedness, +Fatigue, +Generalized Weakness HEENT: No Headaches, Difficulty Swallowing,Tooth/Dental Problems,Sore Throat,  No Sneezing, Rhinitis, Ear Ache, Nasal Congestion, or Post Nasal Drip,  Cardio-vascular:  No Chest pain, Orthopnea, PND, +Edema in Lower Extremities, Anasarca, Dizziness, Palpitations  Resp:    +Dyspnea, No DOE, No Productive Cough, No Non-Productive Cough, No Hemoptysis, No Wheezing.    GI: No Heartburn, Indigestion, Abdominal Pain, Nausea, Vomiting, Diarrhea, Constipation, Hematemesis, Hematochezia, Melena, Change in Bowel Habits,  Loss of Appetite  GU: No Dysuria, No Change in Color of Urine, No Urgency or Urinary Frequency, No Flank pain.  Musculoskeletal: No Joint Pain or Swelling, No Decreased Range of Motion, No Back Pain.  Neurologic: No Syncope, No Seizures, Muscle Weakness, Paresthesia, Vision Disturbance or Loss, No Diplopia, No Vertigo, No Difficulty Walking,  Skin: No Rash or Lesions. Psych: No Change in Mood or Affect, No Depression or Anxiety, No Memory loss, No Confusion, or Hallucinations   Past Medical History  Diagnosis Date  . DIABETES MELLITUS,  TYPE II 01/03/2007  . HYPERCHOLESTEROLEMIA 06/01/2009  . ANEMIA, IRON DEFICIENCY 08/15/2007  . LEUKOPENIA, CHRONIC 06/01/2009  . HYPERTENSION 01/03/2007  . ALLERGIC RHINITIS 01/03/2007  . OSTEOARTHRITIS 01/03/2007  . SHOULDER PAIN, LEFT 02/18/2008  . OSTEOPOROSIS 01/03/2007  . NONSPECIFIC ABNORMAL ELECTROCARDIOGRAM 02/12/2007  . COLONIC POLYPS, HX OF 01/03/2007  . Diabetic retinopathy   . Smoker   . OA (osteoarthritis)      Past Surgical History  Procedure Laterality Date  . Child birth      x's 5  . Dilation and curettage of uterus        Prior to Admission medications   Medication Sig Start Date End Date Taking? Authorizing Provider  furosemide (LASIX) 20 MG tablet Take 1 tablet (20 mg total) by mouth daily. 06/12/14  Yes Renato Shin, MD  lovastatin (MEVACOR) 40 MG tablet TAKE 2 TABLETS (80 MG TOTAL) BY MOUTH AT BEDTIME. 05/04/14  Yes Renato Shin, MD  metFORMIN (GLUCOPHAGE) 1000 MG tablet TAKE 1 TABLET (1,000 MG TOTAL) BY MOUTH 2 (TWO) TIMES DAILY WITH A MEAL. 05/18/14  Yes Renato Shin, MD  diclofenac sodium (VOLTAREN) 1 % GEL Apply 4 g topically 4 (four) times daily. As needed for pain Patient not taking: Reported on 06/12/2014 04/28/14   Renato Shin, MD  donepezil (ARICEPT) 5 MG tablet Take 1 tablet (5 mg total) by mouth at bedtime. Patient not taking: Reported on 06/12/2014 04/28/14   Renato Shin, MD  repaglinide (PRANDIN) 0.5 MG tablet Take 1 tablet (0.5 mg total) by mouth 3 (three) times daily before meals. Patient not taking: Reported on 06/12/2014 06/12/14  Renato Shin, MD     Allergies  Allergen Reactions  . Penicillins     Social History:  reports that she has been smoking Cigarettes.  She has been smoking about 0.50 packs per day. She does not have any smokeless tobacco history on file. She reports that she does not drink alcohol or use illicit drugs.    Family History  Problem Relation Age of Onset  . Diabetes Sister   . Liver disease Daughter     Cirrhosis-transplant  .  Colon cancer Neg Hx   . Breast cancer Maternal Aunt        Physical Exam:  GEN:  Pleasant Elderly Cachectic 81 y.o. African American female examined and in no acute distress; cooperative with exam Filed Vitals:   06/12/14 1826 06/12/14 1926 06/12/14 2030 06/12/14 2058  BP: 137/59 155/66 146/59   Pulse: 94 99 97   Temp: 98.6 F (37 C)     TempSrc: Oral     Resp: 18 28 35 27  SpO2: 95% 91% 94% 100%   Blood pressure 146/59, pulse 97, temperature 98.6 F (37 C), temperature source Oral, resp. rate 27, SpO2 100 %. PSYCH: She is alert and oriented x4; does not appear anxious does not appear depressed; affect is normal HEENT: Normocephalic and Atraumatic, Mucous membranes pink; PERRLA; EOM intact; Fundi:  Benign;  No scleral icterus, Nares: Patent, Oropharynx: Clear, Edentulous,    Neck:  FROM, No Cervical Lymphadenopathy nor Thyromegaly or Carotid Bruit; No JVD; Breasts:: Not examined CHEST WALL: No tenderness CHEST: Normal respiration, clear to auscultation bilaterally HEART: Regular rate and rhythm; no murmurs rubs or gallops BACK: No kyphosis or scoliosis; No CVA tenderness ABDOMEN: Positive Bowel Sounds, Scaphoid, Soft Non-Tender, No Rebound or Guarding; No Masses, No Organomegaly. Rectal Exam: Not done EXTREMITIES: No Cyanosis, Clubbing, 2+Edema to BLEs; No Ulcerations. Genitalia: not examined PULSES: 2+ and symmetric SKIN: Normal hydration no rash or ulceration CNS:  Alert and Oriented x 4, No Focal Deficits Vascular: pulses palpable throughout    Labs on Admission:  Basic Metabolic Panel:  Recent Labs Lab 06/12/14 1843  NA 141  K 3.9  CL 101  GLUCOSE 195*  BUN 13  CREATININE 0.60   Liver Function Tests: No results for input(s): AST, ALT, ALKPHOS, BILITOT, PROT, ALBUMIN in the last 168 hours. No results for input(s): LIPASE, AMYLASE in the last 168 hours. No results for input(s): AMMONIA in the last 168 hours. CBC:  Recent Labs Lab 06/12/14 1450  06/12/14 1843 06/12/14 2140  WBC 4.1  --  4.5  NEUTROABS 2.1  --   --   HGB 6.1 Repeated and verified X2.* 7.8* 5.8*  HCT 19.7 aL* 23.0* 19.6*  MCV 78.6  --  82.0  PLT 254.0  --  219   Cardiac Enzymes: No results for input(s): CKTOTAL, CKMB, CKMBINDEX, TROPONINI in the last 168 hours.  BNP (last 3 results)  Recent Labs  06/12/14 2059  BNP 1133.0*    ProBNP (last 3 results)  Recent Labs  06/12/14 1450  PROBNP 1395.0*    CBG: No results for input(s): GLUCAP in the last 168 hours.  Radiological Exams on Admission: Dg Chest 2 View  06/12/2014   CLINICAL DATA:  Acute shortness of breath with fatigue and weakness in upper and lower extremities for 1-2 weeks, smoker for 75 years, arthritis LEFT shoulder, type 2 diabetes, hypercholesterolemia, hypertension  EXAM: CHEST  2 VIEW  COMPARISON:  03/09/2011  FINDINGS: Enlargement of cardiac silhouette with pulmonary  vascular congestion.  Atherosclerotic calcification aorta.  Biconvex thoracolumbar scoliosis.  Lungs appear emphysematous with minimal atelectasis or scarring in LEFT mid lung.  No definite acute infiltrate, pleural effusion or pneumothorax.  Osseous demineralization with scattered degenerative changes of the thoracolumbar spine.  Advanced LEFT glenohumeral degenerative changes.  IMPRESSION: Enlargement of cardiac silhouette with pulmonary vascular congestion.  COPD changes with minimal atelectasis or scarring LEFT mid lung.  No acute abnormalities.   Electronically Signed   By: Lavonia Dana M.D.   On: 06/12/2014 16:51   Dg Chest Port 1 View  06/12/2014   CLINICAL DATA:  Dyspnea  EXAM: PORTABLE CHEST - 1 VIEW  COMPARISON:  06/12/2014  FINDINGS: There is chronic cardiopericardial enlargement and aortic tortuosity. New cephalization of blood flow and interstitial coarsening. There is a background of COPD with pulmonary hyperinflation. No evidence for pneumonia or effusion. Severe left glenohumeral osteoarthritis incidentally noted.   IMPRESSION: Developing CHF.   Electronically Signed   By: Monte Fantasia M.D.   On: 06/12/2014 21:53     EKG: Independently reviewed. Sinus Tachycardia rate 105   Assessment/Plan:   81 y.o. female with  Principal Problem:   1.    Symptomatic anemia- Iron Deficiency per Iron Stuidies, and Normocytic per MCV =82   Telemetry Monitoring   Transfuse 2-3 units of pRBCs   Send FOBT   Send B12 and Folate levels  Active Problems:   2.    SOB (shortness of breath)- Due to #1, and #3   NCO2 PRN   Monitor O2 sats     3.    Chronic diastolic CHF (congestive heart failure)-    Lasix 40 mg IV X1 given in ED   Lasix Between units of pRBCs     Check CMET    4.    Diabetes   Hold Prandin and Metformin RX   Check HbA1C   SSI coverage PRN   Diabetic Diet     5.    Essential hypertension    On lasix Rx   Monitor BPs     6.    HYPERCHOLESTEROLEMIA   On Lovastatin Rx       7.    Malnutrition   Nutrition Consult for Needs   Check Prealbumin      8.    DVT Prophylaxis   SCDs          Code Status:     FULL CODE       Family Communication:   Family at Bedside     Disposition Plan:    Inpatient Status        Time spent:  [facility] Pager [phone]   If [place] Please Contact the Day Rounding Team MD for [facility]  If 7PM-7AM, Please Contact Night-Floor Coverage  [email or web address] Password [email or web address] 06/12/2014, 10:42 PM     ADDENDUM:   Patient was seen and examined on 06/12/2014

## 2014-06-13 ENCOUNTER — Inpatient Hospital Stay (HOSPITAL_COMMUNITY): Payer: Medicare Other

## 2014-06-13 ENCOUNTER — Encounter (HOSPITAL_COMMUNITY): Payer: Self-pay | Admitting: Radiology

## 2014-06-13 DIAGNOSIS — R55 Syncope and collapse: Secondary | ICD-10-CM

## 2014-06-13 LAB — BASIC METABOLIC PANEL
ANION GAP: 10 (ref 5–15)
BUN: 9 mg/dL (ref 6–23)
CALCIUM: 8.2 mg/dL — AB (ref 8.4–10.5)
CHLORIDE: 95 mmol/L — AB (ref 96–112)
CO2: 36 mmol/L — ABNORMAL HIGH (ref 19–32)
Creatinine, Ser: 0.59 mg/dL (ref 0.50–1.10)
GFR calc Af Amer: >90 mL/min (ref >90)
GFR calc non Af Amer: 78 mL/min — ABNORMAL LOW (ref >90)
GLUCOSE: 144 mg/dL — AB (ref 70–99)
Potassium: 3.6 mmol/L (ref 3.5–5.1)
SODIUM: 141 mmol/L (ref 135–145)

## 2014-06-13 LAB — CBC
HEMATOCRIT: 27.4 % — AB (ref 36.0–46.0)
Hemoglobin: 8.3 g/dL — ABNORMAL LOW (ref 12.0–15.0)
MCH: 24.9 pg — ABNORMAL LOW (ref 26.0–34.0)
MCHC: 30.3 g/dL (ref 30.0–36.0)
MCV: 82.3 fL (ref 78.0–100.0)
PLATELETS: 185 10*3/uL (ref 150–400)
RBC: 3.33 MIL/uL — ABNORMAL LOW (ref 3.87–5.11)
RDW: 18 % — ABNORMAL HIGH (ref 11.5–15.5)
WBC: 4.6 10*3/uL (ref 4.0–10.5)

## 2014-06-13 LAB — GLUCOSE, CAPILLARY
GLUCOSE-CAPILLARY: 158 mg/dL — AB (ref 70–99)
GLUCOSE-CAPILLARY: 113 mg/dL — AB (ref 70–99)
Glucose-Capillary: 97 mg/dL (ref 70–99)

## 2014-06-13 LAB — PREALBUMIN: Prealbumin: 17.5 mg/dL — ABNORMAL LOW (ref 17.0–34.0)

## 2014-06-13 LAB — VITAMIN B12: VITAMIN B 12: 337 pg/mL (ref 211–911)

## 2014-06-13 MED ORDER — IOHEXOL 300 MG/ML  SOLN
100.0000 mL | Freq: Once | INTRAMUSCULAR | Status: AC | PRN
  Administered 2014-06-13: 100 mL via INTRAVENOUS

## 2014-06-13 MED ORDER — IOHEXOL 300 MG/ML  SOLN
50.0000 mL | Freq: Once | INTRAMUSCULAR | Status: AC | PRN
Start: 2014-06-13 — End: 2014-06-13
  Administered 2014-06-13: 50 mL via ORAL

## 2014-06-13 MED ORDER — FUROSEMIDE 10 MG/ML IJ SOLN
20.0000 mg | Freq: Every day | INTRAMUSCULAR | Status: DC
  Administered 2014-06-13 – 2014-06-14 (×2): 20 mg via INTRAVENOUS
  Filled 2014-06-13 (×2): qty 2

## 2014-06-13 MED ORDER — CETYLPYRIDINIUM CHLORIDE 0.05 % MT LIQD
7.0000 mL | Freq: Two times a day (BID) | OROMUCOSAL | Status: DC
  Administered 2014-06-13 – 2014-06-15 (×3): 7 mL via OROMUCOSAL

## 2014-06-13 NOTE — Progress Notes (Signed)
Echocardiogram 2D Echocardiogram has been performed.  Susan Browning 06/13/2014, 11:58 AM

## 2014-06-13 NOTE — Progress Notes (Addendum)
Patient ID: Susan Browning, female   DOB: Oct 18, 1920, 81 y.o.   MRN: [id number]  [facility] PROGRESS NOTE  Susan Browning DOB: 17-Oct-1920 DOA: 06/12/2014 PCP: Renato Shin, MD   Brief narrative:    81 y.o. female with HTN, DM2, Hyperlipemia and CHF who presented to the ED with several days duration of progressive weakness, dyspnea with exertion and present at rest, LE swelling. She went to see PCP the day of the admission and was found to have Hg 5.8. Daughter at bedside reports no similar events in the past but confirmed progressive fatigue, unsteadiness on the feet, poor oral intake.   Assessment/Plan:    Principal Problem:   Symptomatic acute blood loss anemia - pt still fairly weak this AM and sleepy - Hg on admission 5.8 and up to 8.3 post two U PRBC tx 3/4 - plan on transfusing additional unit PRBC today 3/5 - FOBT to be repeated - CT abd for further evaluation - repeat CBC in AM Active Problems:   Acute hypoxic respiratory failure secondary to Acute systolic and diastolic CHF imposed on COPD - weight is 112 lbs on admission - continue Lasix 20 mg IV QD - monitor daily weights and strict I's and O's - 2 D ECHO requested    Diabetes type II - reasonable inpatient control    HYPERCHOLESTEROLEMIA - continue statin    COPD - no wheezing on exam this AM   Essential hypertension - provide hydralazine as needed only for now   Severe PCM - in the context of chronic COPD and now acute illness - advance diet as pt able to tolerate  DVT prophylaxis - SCD's  Code Status: Full.  Family Communication:  plan of care discussed with the patient and daughter at bedside  Disposition Plan: Remains inpatient but will go home when ready   IV access:  Peripheral IV  Procedures and diagnostic studies:    CXR  06/12/2014 CHF. COPD, scarring LEFT mid lung.  No acute abnormalities.   CXR 06/12/2014  Developing CHF.     Medical Consultants:  None  Other  Consultants:  None   IAnti-Infectives:   None  Faye Ramsay, MD  [facility] Pager [phone]  If 7PM-7AM, please contact night-coverage [email or web address] Password [email or web address] 06/13/2014, 3:24 PM   LOS: 1 day   HPI/Subjective: No events overnight.   Objective: Filed Vitals:   06/13/14 0944 06/13/14 1154 06/13/14 1225 06/13/14 1515  BP: 126/65 137/65 133/62 156/82  Pulse: 80 87 85 85  Temp: 98.7 F (37.1 C) 98.1 F (36.7 C) 98.7 F (37.1 C) 98.7 F (37.1 C)  TempSrc: Oral Oral Oral Oral  Resp: 20 24 20 20   Height:      Weight:      SpO2: 100%   98%    Intake/Output Summary (Last 24 hours) at 06/13/14 1524 Last data filed at 06/13/14 1515  Gross per 24 hour  Intake   1308 ml  Output   3315 ml  Net  -2007 ml    Exam:   General:  Pt is sleepy but easy to arouse, NAD  Cardiovascular: Regular rate and rhythm, SEM 2/6, no rubs, no gallops  Respiratory: Clear to auscultation bilaterally, no wheezing, diminished breath sounds at bases   Abdomen: Soft, non tender, non distended, bowel sounds present, no guarding  Extremities: +1 bilateral LE edema, pulses DP and PT palpable bilaterally  Neuro: Grossly nonfocal  Data Reviewed: Basic Metabolic Panel:  Recent Labs Lab 06/12/14 1843 06/12/14  2314 06/13/14 1200  NA 141 138 141  K 3.9 4.0 3.6  CL 101 104 95*  CO2  --  31 36*  GLUCOSE 195* 110* 144*  BUN 13 15 9   CREATININE 0.60 0.56 0.59  CALCIUM  --  8.1* 8.2*   Liver Function Tests:  Recent Labs Lab 06/12/14 2314  AST 26  ALT 10  ALKPHOS 63  BILITOT 0.4  PROT 6.6  ALBUMIN 3.3*   CBC:  Recent Labs Lab 06/12/14 1450 06/12/14 1843 06/12/14 2140 06/13/14 1200  WBC 4.1  --  4.5 4.6  NEUTROABS 2.1  --   --   --   HGB 6.1 Repeated and verified X2.* 7.8* 5.8* 8.3*  HCT 19.7 aL* 23.0* 19.6* 27.4*  MCV 78.6  --  82.0 82.3  PLT 254.0  --  219 185   CBG:  Recent Labs Lab 06/13/14 0757 06/13/14 1149  GLUCAP 97 158*   Scheduled Meds: .  acetaminophen  650 mg Oral Once  . antiseptic oral rinse  7 mL Mouth Rinse BID  . diphenhydrAMINE  25 mg Oral Once  . donepezil  5 mg Oral QHS  . furosemide  20 mg Oral Daily  . pantoprazole  40 mg Oral Daily  . pravastatin  80 mg Oral q1800  . sodium chloride  3 mL Intravenous Q12H  . sodium chloride  3 mL Intravenous Q12H   Continuous Infusions:

## 2014-06-14 DIAGNOSIS — E440 Moderate protein-calorie malnutrition: Secondary | ICD-10-CM | POA: Insufficient documentation

## 2014-06-14 LAB — BASIC METABOLIC PANEL
Anion gap: 6 (ref 5–15)
BUN: 11 mg/dL (ref 6–23)
CHLORIDE: 93 mmol/L — AB (ref 96–112)
CO2: 38 mmol/L — AB (ref 19–32)
CREATININE: 0.65 mg/dL (ref 0.50–1.10)
Calcium: 7.9 mg/dL — ABNORMAL LOW (ref 8.4–10.5)
GFR calc Af Amer: 87 mL/min — ABNORMAL LOW (ref >90)
GFR calc non Af Amer: 75 mL/min — ABNORMAL LOW (ref >90)
Glucose, Bld: 98 mg/dL (ref 70–99)
POTASSIUM: 3.1 mmol/L — AB (ref 3.5–5.1)
Sodium: 137 mmol/L (ref 135–145)

## 2014-06-14 LAB — TYPE AND SCREEN
ABO/RH(D): A POS
Antibody Screen: NEGATIVE
Unit division: 0
Unit division: 0
Unit division: 0

## 2014-06-14 LAB — URINALYSIS, ROUTINE W REFLEX MICROSCOPIC
Bilirubin Urine: NEGATIVE
Glucose, UA: NEGATIVE mg/dL
Hgb urine dipstick: NEGATIVE
Ketones, ur: NEGATIVE mg/dL
Leukocytes, UA: NEGATIVE
Nitrite: NEGATIVE
PH: 7 (ref 5.0–8.0)
Protein, ur: NEGATIVE mg/dL
Specific Gravity, Urine: 1.007 (ref 1.005–1.030)
UROBILINOGEN UA: 1 mg/dL (ref 0.0–1.0)

## 2014-06-14 LAB — BRAIN NATRIURETIC PEPTIDE: B Natriuretic Peptide: 1653.5 pg/mL — ABNORMAL HIGH (ref 0.0–100.0)

## 2014-06-14 LAB — CBC
HCT: 32.6 % — ABNORMAL LOW (ref 36.0–46.0)
HEMOGLOBIN: 10.2 g/dL — AB (ref 12.0–15.0)
MCH: 25.7 pg — AB (ref 26.0–34.0)
MCHC: 31.3 g/dL (ref 30.0–36.0)
MCV: 82.1 fL (ref 78.0–100.0)
PLATELETS: 178 10*3/uL (ref 150–400)
RBC: 3.97 MIL/uL (ref 3.87–5.11)
RDW: 17.6 % — ABNORMAL HIGH (ref 11.5–15.5)
WBC: 5.3 10*3/uL (ref 4.0–10.5)

## 2014-06-14 MED ORDER — POTASSIUM CHLORIDE CRYS ER 20 MEQ PO TBCR
40.0000 meq | EXTENDED_RELEASE_TABLET | Freq: Once | ORAL | Status: AC
  Administered 2014-06-14: 40 meq via ORAL
  Filled 2014-06-14: qty 2

## 2014-06-14 MED ORDER — ENSURE COMPLETE PO LIQD
237.0000 mL | Freq: Two times a day (BID) | ORAL | Status: DC
  Administered 2014-06-14 – 2014-06-15 (×2): 237 mL via ORAL

## 2014-06-14 MED ORDER — FUROSEMIDE 20 MG PO TABS
20.0000 mg | ORAL_TABLET | Freq: Every day | ORAL | Status: DC
Start: 2014-06-15 — End: 2014-06-15
  Administered 2014-06-15: 20 mg via ORAL
  Filled 2014-06-14: qty 1

## 2014-06-14 NOTE — Progress Notes (Signed)
INITIAL NUTRITION ASSESSMENT  DOCUMENTATION CODES Per approved criteria  -Non-severe (moderate) malnutrition in the context of chronic illness -Underweight  Pt meets criteria for moderate MALNUTRITION in the context of chronic illness as evidenced by moderate muscle and fat depletion and mild fluid accumulation.  INTERVENTION: Provide Ensure Complete po BID, each supplement provides 350 kcal and 13 grams of protein Encourage PO intake RD to continue to monitor  NUTRITION DIAGNOSIS: Underweight related to advanced age as evidenced by BMI of 18.2.   Goal: Pt to meet >/= 90% of their estimated nutrition needs   Monitor:  PO and supplemental intake, weight, labs, I/O's  Reason for Assessment: Pt identified as at nutrition risk on the Malnutrition Screen Tool  Admitting Dx: Symptomatic anemia  ASSESSMENT: 81 y.o. female with HTN, DM2, Hyperlipemia and CHF who presented to the ED with several days duration of progressive weakness, dyspnea with exertion and present at rest, LE swelling.Daughter at bedside reports no similar events in the past but confirmed progressive fatigue, unsteadiness on the feet, poor oral intake.   Pt with family in room at time of visit. Pt reports good appetite and eating well this AM. Family agreed that pt did eat well this morning and has had a good appetite. PTA pt would eat 2 meals a day, normally not eating lunch.   Pt with 9 lb weight loss x 2 days (suspect likely d/t fluid). Pt is underweight.   Pt would like to receive Ensure supplements. RD to order.  Nutrition Focused Physical Exam:  Subcutaneous Fat:  Orbital Region: WNL Upper Arm Region: moderate depletion Thoracic and Lumbar Region: NA  Muscle:  Temple Region: moderate depletion Clavicle Bone Region: moderate depletion Clavicle and Acromion Bone Region: moderate depletion Scapular Bone Region: moderate depletion Dorsal Hand: mild depletion Patellar Region: WNL Anterior Thigh Region:  WNL Posterior Calf Region: WNL  Edema: RLE & LLE edema  Labs reviewed: Low K  Height: Ht Readings from Last 1 Encounters:  06/12/14 5\' 4"  (1.626 m)    Weight: Wt Readings from Last 1 Encounters:  06/14/14 106 lb 4.2 oz (48.2 kg)    Ideal Body Weight: 120 lb  % Ideal Body Weight: 88%  Wt Readings from Last 10 Encounters:  06/14/14 106 lb 4.2 oz (48.2 kg)  06/12/14 115 lb (52.164 kg)  04/28/14 111 lb (50.349 kg)  12/23/12 112 lb (50.803 kg)  03/09/11 120 lb 9.5 oz (54.7 kg)  02/07/11 120 lb 8 oz (54.658 kg)  01/03/11 123 lb 12.8 oz (56.155 kg)  08/29/10 120 lb (54.432 kg)  08/18/10 120 lb (54.432 kg)  08/12/10 122 lb 9.6 oz (55.611 kg)    Usual Body Weight: 115-120 lb  % Usual Body Weight: 92%  BMI:  Body mass index is 18.23 kg/(m^2).  Estimated Nutritional Needs: Kcal: 1300-1500 Protein: 60-70g Fluid: 1.3L/day  Skin: intact  Diet Order: Diet heart healthy/carb modified  EDUCATION NEEDS: -No education needs identified at this time   Intake/Output Summary (Last 24 hours) at 06/14/14 1009 Last data filed at 06/14/14 0657  Gross per 24 hour  Intake    578 ml  Output   2075 ml  Net  -1497 ml    Last BM: 3/3  Labs:   Recent Labs Lab 06/12/14 2314 06/13/14 1200 06/14/14 0405  NA 138 141 137  K 4.0 3.6 3.1*  CL 104 95* 93*  CO2 31 36* 38*  BUN 15 9 11   CREATININE 0.56 0.59 0.65  CALCIUM 8.1* 8.2* 7.9*  GLUCOSE 110* 144* 98    CBG (last 3)   Recent Labs  06/13/14 0757 06/13/14 1149 06/13/14 1639  GLUCAP 97 158* 113*    Scheduled Meds: . antiseptic oral rinse  7 mL Mouth Rinse BID  . diphenhydrAMINE  25 mg Oral Once  . donepezil  5 mg Oral QHS  . furosemide  20 mg Intravenous Daily  . pantoprazole  40 mg Oral Daily  . potassium chloride  40 mEq Oral Once  . pravastatin  80 mg Oral q1800  . sodium chloride  3 mL Intravenous Q12H  . sodium chloride  3 mL Intravenous Q12H    Continuous Infusions:   Past Medical History   Diagnosis Date  . DIABETES MELLITUS, TYPE II 01/03/2007  . HYPERCHOLESTEROLEMIA 06/01/2009  . ANEMIA, IRON DEFICIENCY 08/15/2007  . LEUKOPENIA, CHRONIC 06/01/2009  . HYPERTENSION 01/03/2007  . ALLERGIC RHINITIS 01/03/2007  . OSTEOARTHRITIS 01/03/2007  . SHOULDER PAIN, LEFT 02/18/2008  . OSTEOPOROSIS 01/03/2007  . NONSPECIFIC ABNORMAL ELECTROCARDIOGRAM 02/12/2007  . COLONIC POLYPS, HX OF 01/03/2007  . Diabetic retinopathy   . Smoker   . OA (osteoarthritis)     Past Surgical History  Procedure Laterality Date  . Child birth      x's 5  . Dilation and curettage of uterus      Clayton Bibles, MS, RD, LDN Pager: [phone] After Hours Pager: [phone]

## 2014-06-14 NOTE — Progress Notes (Signed)
Patient ID: Susan Browning, female   DOB: [birth year], 81 y.o.   MRN: [id number]  [facility] PROGRESS NOTE  TIMESHA CERVANTEZ WNU:[id number] DOB: 19-Oct-1920 DOA: 06/12/2014 PCP: Renato Shin, MD   Brief narrative:    81 y.o. female with HTN, DM2, Hyperlipemia and CHF who presented to the ED with several days duration of progressive weakness, dyspnea with exertion and present at rest, LE swelling. She went to see PCP the day of the admission and was found to have Hg 5.8. Daughter at bedside reports no similar events in the past but confirmed progressive fatigue, unsteadiness on the feet, poor oral intake.   Assessment/Plan:    Principal Problem:   Symptomatic acute blood loss anemia - pt looks much better this AM, at breakfast with no problems and has no concerns this AM  - Hg on admission 5.8 and up to 10.2 this post three U PRBC transfusion 3/4 - 3/5 - FOBT to be repeated as initial report negative  - CT abd with no clear source of bleeding but prominent uterus noted, suggestion was to get pelvic US but pt wants to hold off on it for now  - will repeat CBC in AM and if starts trending down, may need to consult GI or consider doing pelvic US for further evaluation - will respect pt's wishes and hold off on additional testing for now  Active Problems:   Acute hypoxic respiratory failure secondary to Acute systolic and diastolic CHF imposed on COPD - weight is 112 lbs on admission and this AM down to 106 lbs - continue Lasix 20 but change to PO today due to significant diuresis  - monitor daily weights and strict I's and O's - 2 D ECHO with combined diastolic and systolic CHF, EF 03% - will place on low dose Lisinopril    Urinary frequency - from lasix, will check UA just in case   Hypokalemia - from Lasix - will supplement and repeat BMP in AM   Diabetes type II - reasonable inpatient control  - diet controlled    HYPERCHOLESTEROLEMIA - continue statin    COPD - no wheezing  on exam this AM - stable respiratory status    Essential hypertension - place on low dose Lisinopril as noted above    Severe PCM - in the context of chronic COPD and now acute illness - advance diet as pt able to tolerate - provide nutritional supplements  DVT prophylaxis - SCD's  Code Status: Full.  Family Communication:  plan of care discussed with the patient and daughter at bedside  Disposition Plan: Remains inpatient but can likely go home in am if Hg remains stable   IV access:  Peripheral IV  Procedures and diagnostic studies:    CXR  06/12/2014 CHF. COPD, scarring LEFT mid lung.  No acute abnormalities.   CXR 06/12/2014  Developing CHF.     Ct Abdomen Pelvis W Contrast  06/13/2014  Prominent lower uterus/ cervix. Consider direct inspection or pelvic ultrasound is indicated.  Tiny left pleural effusion and left basilar atelectasis of uncertain chronicity.  Cardiomegaly.  Left adrenal adenoma and moderate bilateral renal cortical atrophy.    Medical Consultants:  None  Other Consultants:  None   IAnti-Infectives:   None  Faye Ramsay, MD  [facility] Pager [phone]  If 7PM-7AM, please contact night-coverage [email or web address] Password [email or web address] 06/14/2014, 2:33 PM   LOS: 2 days   HPI/Subjective: No events overnight.   Objective: Filed Vitals:   06/14/14 0503  06/14/14 0839 06/14/14 0845 06/14/14 0848  BP:      Pulse:      Temp:      TempSrc:      Resp:      Height:      Weight: 48.2 kg (106 lb 4.2 oz)     SpO2:  90% 91% 95%    Intake/Output Summary (Last 24 hours) at 06/14/14 1433 Last data filed at 06/14/14 0657  Gross per 24 hour  Intake    578 ml  Output   1125 ml  Net   -547 ml    Exam:   General:  Pt is feeling better, NAD, alert and awake   Cardiovascular: Regular rate and rhythm, SEM 2/6, no rubs, no gallops  Respiratory: Clear to auscultation bilaterally, no wheezing, diminished breath sounds at bases   Abdomen: Soft, non tender, non distended,  bowel sounds present, no guarding  Extremities: trace bilateral LE edema, pulses DP and PT palpable bilaterally  Neuro: Grossly nonfocal  Data Reviewed: Basic Metabolic Panel:  Recent Labs Lab 06/12/14 1843 06/12/14 2314 06/13/14 1200 06/14/14 0405  NA 141 138 141 137  K 3.9 4.0 3.6 3.1*  CL 101 104 95* 93*  CO2  --  31 36* 38*  GLUCOSE 195* 110* 144* 98  BUN 13 15 9 11   CREATININE 0.60 0.56 0.59 0.65  CALCIUM  --  8.1* 8.2* 7.9*   Liver Function Tests:  Recent Labs Lab 06/12/14 2314  AST 26  ALT 10  ALKPHOS 63  BILITOT 0.4  PROT 6.6  ALBUMIN 3.3*   CBC:  Recent Labs Lab 06/12/14 1450 06/12/14 1843 06/12/14 2140 06/13/14 1200 06/14/14 0405  WBC 4.1  --  4.5 4.6 5.3  NEUTROABS 2.1  --   --   --   --   HGB 6.1 Repeated and verified X2.* 7.8* 5.8* 8.3* 10.2*  HCT 19.7 aL* 23.0* 19.6* 27.4* 32.6*  MCV 78.6  --  82.0 82.3 82.1  PLT 254.0  --  219 185 178   CBG:  Recent Labs Lab 06/13/14 0757 06/13/14 1149 06/13/14 1639  GLUCAP 97 158* 113*   Scheduled Meds: . antiseptic oral rinse  7 mL Mouth Rinse BID  . diphenhydrAMINE  25 mg Oral Once  . donepezil  5 mg Oral QHS  . feeding supplement (ENSURE COMPLETE)  237 mL Oral BID BM  . [START ON 06/15/2014] furosemide  20 mg Oral Daily  . pantoprazole  40 mg Oral Daily  . pravastatin  80 mg Oral q1800  . sodium chloride  3 mL Intravenous Q12H  . sodium chloride  3 mL Intravenous Q12H   Continuous Infusions:

## 2014-06-15 DIAGNOSIS — I5043 Acute on chronic combined systolic (congestive) and diastolic (congestive) heart failure: Secondary | ICD-10-CM | POA: Diagnosis present

## 2014-06-15 LAB — CBC
HEMATOCRIT: 33 % — AB (ref 36.0–46.0)
Hemoglobin: 10.2 g/dL — ABNORMAL LOW (ref 12.0–15.0)
MCH: 25.7 pg — AB (ref 26.0–34.0)
MCHC: 30.9 g/dL (ref 30.0–36.0)
MCV: 83.1 fL (ref 78.0–100.0)
PLATELETS: 171 10*3/uL (ref 150–400)
RBC: 3.97 MIL/uL (ref 3.87–5.11)
RDW: 17.8 % — ABNORMAL HIGH (ref 11.5–15.5)
WBC: 6.7 10*3/uL (ref 4.0–10.5)

## 2014-06-15 LAB — FOLATE RBC
FOLATE, RBC: 1435 ng/mL (ref >498)
Folate, Hemolysate: 274 ng/mL
Hematocrit: 19.1 % — ABNORMAL LOW (ref 34.0–46.6)

## 2014-06-15 LAB — BASIC METABOLIC PANEL
ANION GAP: 8 (ref 5–15)
BUN: 21 mg/dL (ref 6–23)
CALCIUM: 8.3 mg/dL — AB (ref 8.4–10.5)
CO2: 37 mmol/L — ABNORMAL HIGH (ref 19–32)
Chloride: 91 mmol/L — ABNORMAL LOW (ref 96–112)
Creatinine, Ser: 0.64 mg/dL (ref 0.50–1.10)
GFR calc non Af Amer: 76 mL/min — ABNORMAL LOW (ref >90)
GFR, EST AFRICAN AMERICAN: 88 mL/min — AB (ref >90)
GLUCOSE: 110 mg/dL — AB (ref 70–99)
Potassium: 3.7 mmol/L (ref 3.5–5.1)
Sodium: 136 mmol/L (ref 135–145)

## 2014-06-15 MED ORDER — ENSURE COMPLETE PO LIQD: 237.0000 mL | Freq: Two times a day (BID) | ORAL | Status: DC

## 2014-06-15 NOTE — Progress Notes (Signed)
SATURATION QUALIFICATIONS: (This note is used to comply with regulatory documentation for home oxygen)  Patient Saturations on Room Air at Rest = 96%  Patient Saturations on Room Air while Ambulating = 92%  Blondell Reveal Kistler PT 06/15/2014  [phone]

## 2014-06-15 NOTE — Discharge Summary (Signed)
Physician Discharge Summary  Susan Browning NOM:[id number] DOB: Dec 06, 1920 DOA: 06/12/2014  PCP: Renato Shin, MD  Admit date: 06/12/2014 Discharge date: 06/15/2014  Time spent: 70 minutes  Recommendations for Outpatient Follow-up:  1. Follow-up with Renato Shin, MD in 1 week. Follow-up patient in need a CBC done to follow-up on H&H. Patient needs a basic metabolic profile done to follow-up on electrolytes and renal function. Patient's acute on chronic combined diastolic and systolic heart failure need to be reassessed per PCP.  Discharge Diagnoses:  Principal Problem:   Symptomatic anemia Active Problems:   Acute on chronic combined systolic and diastolic heart failure   Diabetes   HYPERCHOLESTEROLEMIA   Essential hypertension   SOB (shortness of breath)   Chronic diastolic CHF (congestive heart failure)   Malnutrition   Malnutrition of moderate degree   Discharge Condition: Stable and improved  Diet recommendation: Heart healthy  Filed Weights   06/12/14 2356 06/14/14 0503 06/15/14 0434  Weight: 51.2 kg (112 lb 14 oz) 48.2 kg (106 lb 4.2 oz) 47.8 kg (105 lb 6.1 oz)    History of present illness:  Susan Browning is a 81 y.o. female with a history of HTN, DM2, Hyperlipemia and CHF who presented to the ED with complaints of increasing Weakness, fatrigue and sob over a 2 week period. She denied any nausea or vomiting or diarrhea, or ABD Pain or chest Pain, also denied any hematemesis, or hematochezia, or melena. She was found to have a hemoglobin of 5.8 in the ED. Seen by PCP on the day of admission due to increased edema of BLEs x weeks, had 2D Echo done on the day of admission.   Hospital Course:  #1 symptomatic anemia Patient was admitted with complaints of increasing weakness fatigue and shortness of breath over the past 2 weeks. Hemoglobin which was drawn in the ED came back at 5.8. Patient denied any hematemesis melena or hematochezia. FOBT which was done was  negative. CT of the abdomen and pelvis was done had also bleeding however had prominent uterus noted. It had recommended a pelvic ultrasound however patient wanted to hold off on that for now. Patient was transfused 3 units of packed red blood cells and her hemoglobin improved and stabilized that 10.2. Patient improved clinically and be discharged home in stable and improved condition.  #2 acute on chronic combined diastolic and systolic heart failure Patient had presented with complaints of weakness shortness of breath and fatigue over 2 week. In addition to lower extremity edema. Pro BNP which was done was elevated at 1653.5. Point-of-care cardiac enzymes which was cycled were negative. Patient denied any chest pain. EKG which was done showed a sinus tachycardia with left bundle branch block which was old. Patient was diuresed with IV Lasix and diuresed well and was -3.41 L during this hospitalization. Patient's weight on admission was 112 pounds in of [place] discharge was down to 105 pounds. Patient was transitioned to oral Lasix. 2-D echo which was done showed a EF of 45%-50% with mild diffuse hypokinesis. Patient was maintained on Lasix and her statin. Patient blood pressure remained stable. Patient improved clinically and was back to baseline by day of discharge. Patient be discharged home in stable and improved condition and is to follow-up with PCP as outpatient.  #3 hypokalemia Patient was noted to be hypokalemic likely secondary to diureses. Patient's potassium was repleted.  #4 COPD Remained stable throughout the hospitalization  The rest of patient's chronic medical issues remained stable throughout the  hospitalization and patient will be discharged in stable and improved condition.  Procedures:  2-D echo 06/13/2014   Chest x-ray 06/12/2014  CT abdomen and pelvis 06/13/2014  Consultations:  None  Discharge Exam: Filed Vitals:   06/15/14 1140  BP: 126/57  Pulse:   Temp:    Resp:     General: NAD Cardiovascular: RRR Respiratory: CTAB  Discharge Instructions   Discharge Instructions    Diet - low sodium heart healthy    Complete by:  As directed      Discharge instructions    Complete by:  As directed   Follow up with Renato Shin, MD in 1 week.     Increase activity slowly    Complete by:  As directed           Current Discharge Medication List    START taking these medications   Details  feeding supplement, ENSURE COMPLETE, (ENSURE COMPLETE) LIQD Take 237 mLs by mouth 2 (two) times daily between meals.      CONTINUE these medications which have NOT CHANGED   Details  furosemide (LASIX) 20 MG tablet Take 1 tablet (20 mg total) by mouth daily. Qty: 30 tablet, Refills: 3    lovastatin (MEVACOR) 40 MG tablet TAKE 2 TABLETS (80 MG TOTAL) BY MOUTH AT BEDTIME. Qty: 60 tablet, Refills: 0    metFORMIN (GLUCOPHAGE) 1000 MG tablet TAKE 1 TABLET (1,000 MG TOTAL) BY MOUTH 2 (TWO) TIMES DAILY WITH A MEAL. Qty: 180 tablet, Refills: 1    donepezil (ARICEPT) 5 MG tablet Take 1 tablet (5 mg total) by mouth at bedtime. Qty: 30 tablet, Refills: 11    repaglinide (PRANDIN) 0.5 MG tablet Take 1 tablet (0.5 mg total) by mouth 3 (three) times daily before meals. Qty: 90 tablet, Refills: 11      STOP taking these medications     diclofenac sodium (VOLTAREN) 1 % GEL        Allergies  Allergen Reactions  . Penicillins    Follow-up Information    Follow up with [facility].   Why:  HHPT/HHOT/aide/social worker   Contact information:   [address] [place] [address] [phone]       Follow up with [facility].   Why:  rolling walker   Contact information:   [address] [place] [address] [phone]       Follow up with Renato Shin, MD. Schedule an appointment as soon as possible for a visit in 1 week.   Specialty:  Endocrinology   Contact information:   [address] [place] [address] [phone]        The results of significant diagnostics from this hospitalization (including imaging, microbiology, ancillary and laboratory) are listed below for reference.    Significant Diagnostic Studies: Dg Chest 2 View  06/12/2014   CLINICAL DATA:  Acute shortness of breath with fatigue and weakness in upper and lower extremities for 1-2 weeks, smoker for 75 years, arthritis LEFT shoulder, type 2 diabetes, hypercholesterolemia, hypertension  EXAM: CHEST  2 VIEW  COMPARISON:  03/09/2011  FINDINGS: Enlargement of cardiac silhouette with pulmonary vascular congestion.  Atherosclerotic calcification aorta.  Biconvex thoracolumbar scoliosis.  Lungs appear emphysematous with minimal atelectasis or scarring in LEFT mid lung.  No definite acute infiltrate, pleural effusion or pneumothorax.  Osseous demineralization with scattered degenerative changes of the thoracolumbar spine.  Advanced LEFT glenohumeral degenerative changes.  IMPRESSION: Enlargement of cardiac  silhouette with pulmonary vascular congestion.  COPD changes with minimal atelectasis or scarring LEFT mid lung.  No acute abnormalities.   Electronically Signed   By: Lavonia Dana M.D.   On: 06/12/2014 16:51   Ct Abdomen Pelvis W Contrast  06/13/2014   CLINICAL DATA:  81 year old female with increasing weakness, fatigue, shortness of breath and acute blood loss anemia.  EXAM: CT ABDOMEN AND PELVIS WITH CONTRAST  TECHNIQUE: Multidetector CT imaging of the abdomen and pelvis was performed using the standard protocol following bolus administration of intravenous contrast.  CONTRAST:  158mL OMNIPAQUE IOHEXOL 300 MG/ML  SOLN  COMPARISON:  None.  FINDINGS: Lower chest: A tiny left pleural effusion and left basilar atelectasis noted. Cardiomegaly is identified.  Hepatobiliary: The liver and gallbladder are unremarkable. There is no evidence of biliary dilatation.  Pancreas: Unremarkable  Spleen: Unremarkable   Adrenals/Urinary Tract: A 1 x 1.5 cm left adrenal mass is a relative washout of 41% a compatible with an adenoma.  Moderate bilateral renal cortical atrophy and probable right renal cysts identified.  The bladder is unremarkable. There is no evidence of hydronephrosis.  Stomach/Bowel: There is no evidence of bowel obstruction or pneumoperitoneum.  No focal bowel abnormalities are identified.  Vascular/Lymphatic: Abdominal aortic atherosclerotic calcifications are noted without aneurysm. No enlarged lymph nodes are identified.  Reproductive: The lower uterus/ cervix is prominent.  Other: No free fluid or abscess identified.  Musculoskeletal: Moderate to severe scoliosis and degenerative changes in the lumbar spine identified. No acute bony abnormalities or suspicious bony lesions identified.  IMPRESSION: Prominent lower uterus/ cervix. Consider direct inspection or pelvic ultrasound is indicated.  Tiny left pleural effusion and left basilar atelectasis of uncertain chronicity.  Cardiomegaly.  Left adrenal adenoma and moderate bilateral renal cortical atrophy.   Electronically Signed   By: Margarette Canada M.D.   On: 06/13/2014 19:43   Dg Chest Port 1 View  06/12/2014   CLINICAL DATA:  Dyspnea  EXAM: PORTABLE CHEST - 1 VIEW  COMPARISON:  06/12/2014  FINDINGS: There is chronic cardiopericardial enlargement and aortic tortuosity. New cephalization of blood flow and interstitial coarsening. There is a background of COPD with pulmonary hyperinflation. No evidence for pneumonia or effusion. Severe left glenohumeral osteoarthritis incidentally noted.  IMPRESSION: Developing CHF.   Electronically Signed   By: Monte Fantasia M.D.   On: 06/12/2014 21:53    Microbiology: No results found for this or any previous visit (from the past 240 hour(s)).   Labs: Basic Metabolic Panel:  Recent Labs Lab 06/12/14 1843 06/12/14 2314 06/13/14 1200 06/14/14 0405 06/15/14 0416  NA 141 138 141 137 136  K 3.9 4.0 3.6 3.1* 3.7   CL 101 104 95* 93* 91*  CO2  --  31 36* 38* 37*  GLUCOSE 195* 110* 144* 98 110*  BUN 13 15 9 11 21   CREATININE 0.60 0.56 0.59 0.65 0.64  CALCIUM  --  8.1* 8.2* 7.9* 8.3*   Liver Function Tests:  Recent Labs Lab 06/12/14 2314  AST 26  ALT 10  ALKPHOS 63  BILITOT 0.4  PROT 6.6  ALBUMIN 3.3*   No results for input(s): LIPASE, AMYLASE in the last 168 hours. No results for input(s): AMMONIA in the last 168 hours. CBC:  Recent Labs Lab 06/12/14 1450 06/12/14 1843 06/12/14 2140 06/13/14 1200 06/14/14 0405 06/15/14 0416  WBC 4.1  --  4.5 4.6 5.3 6.7  NEUTROABS 2.1  --   --   --   --   --  HGB 6.1 Repeated and verified X2.* 7.8* 5.8* 8.3* 10.2* 10.2*  HCT 19.7 aL* 23.0* 19.6* 27.4* 32.6* 33.0*  MCV 78.6  --  82.0 82.3 82.1 83.1  PLT 254.0  --  219 185 178 171   Cardiac Enzymes: No results for input(s): CKTOTAL, CKMB, CKMBINDEX, TROPONINI in the last 168 hours. BNP: BNP (last 3 results)  Recent Labs  06/12/14 2059 06/14/14 0405  BNP 1133.0* 1653.5*    ProBNP (last 3 results)  Recent Labs  06/12/14 1450  PROBNP 1395.0*    CBG:  Recent Labs Lab 06/13/14 0757 06/13/14 1149 06/13/14 1639  GLUCAP 97 158* 113*       Signed:  Anjanae Woehrle MD [facility] 06/15/2014, 2:11 PM

## 2014-06-15 NOTE — Evaluation (Signed)
Occupational Therapy Evaluation Patient Details Name: Susan Browning MRN: [id number] DOB: 21-Feb-1921 Today's Date: 06/15/2014    History of Present Illness Susan Browning is a 81 y.o. female with a history of HTN, DM2, Hyperlipemia and CHF who presents to the ED with complaints of increasing Weakness, fatrigue and sob over a 2 week period.Hemoglobin 5.8 on admission.    Clinical Impression   Pt admitted with weakness. Pt currently with functional limitations due to the deficits listed below (see OT Problem List).  Pt will benefit from skilled OT to increase their safety and independence with ADL and functional mobility for ADL to facilitate discharge to venue listed below.      Follow Up Recommendations  Home health OT    Equipment Recommendations  None recommended by OT       Precautions / Restrictions Precautions Precautions: Fall      Mobility Bed Mobility Overal bed mobility: Needs Assistance             General bed mobility comments: pt in chair  Transfers Overall transfer level: Needs assistance Equipment used: Rolling walker (2 wheeled) Transfers: Sit to/from Stand Sit to Stand: Min guard         General transfer comment: no reports of dizziness    Balance Overall balance assessment: Modified Independent (pt/family deny h/o falls)                                          ADL Overall ADL's : Needs assistance/impaired Eating/Feeding: Set up;Sitting   Grooming: Min guard   Upper Body Bathing: Supervision/ safety;Sitting   Lower Body Bathing: Minimal assistance;Sit to/from stand   Upper Body Dressing : Set up;Sitting   Lower Body Dressing: Minimal assistance;Sit to/from stand   Toilet Transfer: Minimal Office manager and Hygiene: Minimal assistance;Sit to/from stand       Functional mobility during ADLs: Minimal assistance General ADL Comments: family will provide A as  needed               Pertinent Vitals/Pain Pain Assessment: No/denies pain        Extremity/Trunk Assessment Upper Extremity Assessment Upper Extremity Assessment: Overall WFL for tasks assessed   Lower Extremity Assessment Lower Extremity Assessment: Overall WFL for tasks assessed   Cervical / Trunk Assessment Cervical / Trunk Assessment: Normal   Communication Communication Communication: No difficulties   Cognition Arousal/Alertness: Awake/alert Behavior During Therapy: WFL for tasks assessed/performed Overall Cognitive Status: Within Functional Limits for tasks assessed                                Home Living Family/patient expects to be discharged to:: Private residence Living Arrangements: Spouse/significant other (and grandchild) Available Help at Discharge: Family;Available 24 hours/day   Home Access: Stairs to enter Entrance Stairs-Number of Steps: 3 Entrance Stairs-Rails: None Home Layout: One level     Bathroom Shower/Tub: Tub/shower unit Shower/tub characteristics: Door       Home Equipment: Cane - single point          Prior Functioning/Environment Level of Independence: Independent             OT Diagnosis: Generalized weakness   OT Problem List: Decreased strength;Decreased activity tolerance   OT Treatment/Interventions: Self-care/ADL training;Energy conservation;Patient/family education    OT Goals(Current goals  can be found in the care plan section) Acute Rehab OT Goals Patient Stated Goal: to get some rest at home OT Goal Formulation: With patient Time For Goal Achievement: 06/29/14 Potential to Achieve Goals: Good  OT Frequency: Min 2X/week              End of Session Nurse Communication: Mobility status  Activity Tolerance: Patient tolerated treatment well Patient left: in chair;with call bell/phone within reach   Time: 1220-1240 OT Time Calculation (min): 20 min Charges:  OT General Charges $OT  Visit: 1 Procedure OT Evaluation $Initial OT Evaluation Tier I: 1 Procedure G-Codes:    Betsy Pries 06/28/14, 12:46 PM

## 2014-06-15 NOTE — Care Management Note (Signed)
    Page 1 of 2   06/15/2014     3:59:01 PM CARE MANAGEMENT NOTE 06/15/2014  Patient:  Susan Browning, Susan Browning   Account Number:  [id number]  Date Initiated:  06/15/2014  Documentation initiated by:  Dessa Phi  Subjective/Objective Assessment:   81 y/o f admitted w/anemia.     Action/Plan:   From home w/dtr.   Anticipated DC Date:  06/15/2014   Anticipated DC Plan:  [place]  CM consult      Choice offered to / List presented to:  C-4 Adult Children   DME arranged  [place]      DME agency  [place] arranged  [facility].   Status of service:  Completed, signed off Medicare Important Message given?  YES (If response is "NO", the following Medicare IM given date fields will be blank) Date Medicare IM given:  06/15/2014 Medicare IM given by:  [facility] Date Additional Medicare IM given:   Additional Medicare IM given by:    Discharge Disposition:  [facility]  Per UR Regulation:  Reviewed for med. necessity/level of care/duration of stay  If discussed at [facility] of Stay Meetings, dates discussed:    Comments:  06/15/14 Dessa Phi RN BSN NCM [address] [place] rep aware of d/c & hhc orders, dme rep [facility] delivered rw to rm. No further d/c needs.

## 2014-06-15 NOTE — [place] Notes (Signed)
Went over all discharge information with pt and family.  All questions answered.  VSS.  O2 levels stayed above 90 throughout the morning during periodic checks.  Pt wheeled out by RN.

## 2014-06-15 NOTE — Evaluation (Signed)
Physical Therapy Evaluation Patient Details Name: Susan Browning MRN: [id number] DOB: 04-10-1921 Today's Date: 06/15/2014   History of Present Illness  Susan Browning is a 81 y.o. female with a history of HTN, DM2, Hyperlipemia and CHF who presents to the ED with complaints of increasing Weakness, fatrigue and sob over a 2 week period.Hemoglobin 5.8 on admission.   Clinical Impression  Pt admitted with above diagnosis. Pt currently with functional limitations due to the deficits listed below (see PT Problem List).  Pt will benefit from skilled PT to increase their independence and safety with mobility to allow discharge to the venue listed below.    Pt ambulated 120' with RW with min/guard assist for balance. SaO2 92% on RA with walking, no dyspnea noted. Pt had LOB x 1 while walking but was able to self correct. Encouraged pt to have assist when walking at home and she is less steady than her baseline. RW and HHPT recommended.      Follow Up Recommendations Home health PT    Equipment Recommendations  Rolling walker with 5" wheels    Recommendations for Other Services       Precautions / Restrictions        Mobility  Bed Mobility Overal bed mobility: Modified Independent             General bed mobility comments: pt reported dizziness with supine to sit, BP sitting 115/59  Transfers Overall transfer level: Needs assistance Equipment used: Rolling walker (2 wheeled) Transfers: Sit to/from Stand Sit to Stand: Min guard         General transfer comment: min guard for safety due to reported dizziness (pt not orthostatic with sit to stand)  Ambulation/Gait Ambulation/Gait assistance: Min guard Ambulation Distance (Feet): 120 Feet Assistive device: Rolling walker (2 wheeled) Gait Pattern/deviations: Step-through pattern   Gait velocity interpretation: at or above normal speed for age/gender General Gait Details: mild LOB x 1 during 180* turn, pt able to self  correct, min/guard for safety/balance, SaO2 92% on RA walking  Stairs            Wheelchair Mobility    Modified Rankin (Stroke Patients Only)       Balance Overall balance assessment: Modified Independent (pt/family deny h/o falls)                                           Pertinent Vitals/Pain Pain Assessment: No/denies pain    Home Living Family/patient expects to be discharged to:: Private residence Living Arrangements: Spouse/significant other (and grandchild) Available Help at Discharge: Family;Available 24 hours/day   Home Access: Stairs to enter Entrance Stairs-Rails: None Entrance Stairs-Number of Steps: 3   Home Equipment: Cane - single point      Prior Function Level of Independence: Independent               Hand Dominance        Extremity/Trunk Assessment   Upper Extremity Assessment: Overall WFL for tasks assessed           Lower Extremity Assessment: Overall WFL for tasks assessed      Cervical / Trunk Assessment: Normal  Communication   Communication: No difficulties  Cognition Arousal/Alertness: Awake/alert   Overall Cognitive Status: Within Functional Limits for tasks assessed  General Comments      Exercises        Assessment/Plan    PT Assessment Patient needs continued PT services  PT Diagnosis Generalized weakness   PT Problem List Decreased activity tolerance;Decreased balance;Decreased mobility  PT Treatment Interventions DME instruction;Gait training;Functional mobility training;Stair training;Therapeutic exercise   PT Goals (Current goals can be found in the Care Plan section) Acute Rehab PT Goals Patient Stated Goal: to get some rest at home PT Goal Formulation: With patient/family Time For Goal Achievement: 06/29/14 Potential to Achieve Goals: Good    Frequency Min 3X/week   Barriers to discharge        Co-evaluation               End of  Session Equipment Utilized During Treatment: Gait belt Activity Tolerance: Patient tolerated treatment well Patient left: in chair;with call bell/phone within reach;with chair alarm set;with family/visitor present Nurse Communication: Mobility status         Time: 8768-1157 PT Time Calculation (min) (ACUTE ONLY): 26 min   Charges:   PT Evaluation $Initial PT Evaluation Tier I: 1 Procedure PT Treatments $Gait Training: 8-22 mins   PT G Codes:        Philomena Doheny 06/15/2014, 12:16 PM  [phone]

## 2014-06-17 ENCOUNTER — Telehealth: Payer: Self-pay | Admitting: Endocrinology

## 2014-06-17 DIAGNOSIS — M6281 Muscle weakness (generalized): Secondary | ICD-10-CM | POA: Diagnosis not present

## 2014-06-17 DIAGNOSIS — D649 Anemia, unspecified: Secondary | ICD-10-CM | POA: Diagnosis not present

## 2014-06-17 DIAGNOSIS — R269 Unspecified abnormalities of gait and mobility: Secondary | ICD-10-CM | POA: Diagnosis not present

## 2014-06-17 DIAGNOSIS — I5042 Chronic combined systolic (congestive) and diastolic (congestive) heart failure: Secondary | ICD-10-CM | POA: Diagnosis not present

## 2014-06-17 DIAGNOSIS — I10 Essential (primary) hypertension: Secondary | ICD-10-CM | POA: Diagnosis not present

## 2014-06-17 DIAGNOSIS — E440 Moderate protein-calorie malnutrition: Secondary | ICD-10-CM | POA: Diagnosis not present

## 2014-06-17 NOTE — Telephone Encounter (Signed)
Susan Browning from [facility] called need to speak to you concerning patient prescriptions Phone# [phone]

## 2014-06-18 NOTE — Telephone Encounter (Signed)
Susan Browning from [facility] also wonders if she needs to be checking her blood sugar and if she needs a home health nurse to go out to her. The comode order is coming to [place] they are ordering a higher comode for the pt. They fax number for the order if needed for the home nurse is # [phone]

## 2014-06-18 NOTE — Telephone Encounter (Signed)
Please read message below and advise.  

## 2014-06-18 NOTE — Telephone Encounter (Signed)
check your blood sugar once a day.  vary the time of day when you check, between before the 3 meals, and at bedtime.  also check if you have symptoms of your blood sugar being too high or too low.  please keep a record of the readings and bring it to your next appointment here.  You can write it on any piece of paper.  please call us sooner if your blood sugar goes below 70, or if you have a lot of readings over 200.

## 2014-06-19 NOTE — Telephone Encounter (Signed)
Attempted to call patient but line busy.

## 2014-06-19 NOTE — Telephone Encounter (Signed)
Attempted to call patient but line was busy.

## 2014-06-20 DIAGNOSIS — I10 Essential (primary) hypertension: Secondary | ICD-10-CM | POA: Diagnosis not present

## 2014-06-20 DIAGNOSIS — E440 Moderate protein-calorie malnutrition: Secondary | ICD-10-CM | POA: Diagnosis not present

## 2014-06-20 DIAGNOSIS — R269 Unspecified abnormalities of gait and mobility: Secondary | ICD-10-CM | POA: Diagnosis not present

## 2014-06-20 DIAGNOSIS — D649 Anemia, unspecified: Secondary | ICD-10-CM | POA: Diagnosis not present

## 2014-06-20 DIAGNOSIS — M6281 Muscle weakness (generalized): Secondary | ICD-10-CM | POA: Diagnosis not present

## 2014-06-20 DIAGNOSIS — I5042 Chronic combined systolic (congestive) and diastolic (congestive) heart failure: Secondary | ICD-10-CM | POA: Diagnosis not present

## 2014-06-22 ENCOUNTER — Telehealth: Payer: Self-pay | Admitting: Endocrinology

## 2014-06-22 DIAGNOSIS — R269 Unspecified abnormalities of gait and mobility: Secondary | ICD-10-CM | POA: Diagnosis not present

## 2014-06-22 DIAGNOSIS — I5042 Chronic combined systolic (congestive) and diastolic (congestive) heart failure: Secondary | ICD-10-CM | POA: Diagnosis not present

## 2014-06-22 DIAGNOSIS — D649 Anemia, unspecified: Secondary | ICD-10-CM | POA: Diagnosis not present

## 2014-06-22 DIAGNOSIS — E440 Moderate protein-calorie malnutrition: Secondary | ICD-10-CM | POA: Diagnosis not present

## 2014-06-22 DIAGNOSIS — M6281 Muscle weakness (generalized): Secondary | ICD-10-CM | POA: Diagnosis not present

## 2014-06-22 DIAGNOSIS — I10 Essential (primary) hypertension: Secondary | ICD-10-CM | POA: Diagnosis not present

## 2014-06-22 NOTE — Telephone Encounter (Signed)
[facility] advising [facility] we have not received the prescription for the higher commode and to have the rx faxed to our office. Advised the pt is to be checking her blood sugar 1 time per day, but order for [facility] to do blood sugar checks has not been placed. Requested call back if [facility] would like to discuss.

## 2014-06-22 NOTE — Telephone Encounter (Signed)
[name] is returning your call concerning patient Susan Browning

## 2014-06-23 ENCOUNTER — Ambulatory Visit (HOSPITAL_COMMUNITY): Payer: Medicare Other | Attending: Endocrinology

## 2014-06-23 NOTE — Telephone Encounter (Signed)
Tried to contact Seth Bake from [facility]. Unable to reach her, lvom advising her to call back.

## 2014-06-24 DIAGNOSIS — R269 Unspecified abnormalities of gait and mobility: Secondary | ICD-10-CM | POA: Diagnosis not present

## 2014-06-24 DIAGNOSIS — M6281 Muscle weakness (generalized): Secondary | ICD-10-CM | POA: Diagnosis not present

## 2014-06-24 DIAGNOSIS — I5042 Chronic combined systolic (congestive) and diastolic (congestive) heart failure: Secondary | ICD-10-CM | POA: Diagnosis not present

## 2014-06-24 DIAGNOSIS — I10 Essential (primary) hypertension: Secondary | ICD-10-CM | POA: Diagnosis not present

## 2014-06-24 DIAGNOSIS — E440 Moderate protein-calorie malnutrition: Secondary | ICD-10-CM | POA: Diagnosis not present

## 2014-06-24 DIAGNOSIS — D649 Anemia, unspecified: Secondary | ICD-10-CM | POA: Diagnosis not present

## 2014-06-26 ENCOUNTER — Encounter: Payer: Self-pay | Admitting: Endocrinology

## 2014-06-26 ENCOUNTER — Ambulatory Visit (INDEPENDENT_AMBULATORY_CARE_PROVIDER_SITE_OTHER): Payer: Medicare Other | Admitting: Endocrinology

## 2014-06-26 VITALS — BP 130/80 | HR 92 | Temp 98.5°F

## 2014-06-26 DIAGNOSIS — I10 Essential (primary) hypertension: Secondary | ICD-10-CM | POA: Diagnosis not present

## 2014-06-26 DIAGNOSIS — D649 Anemia, unspecified: Secondary | ICD-10-CM | POA: Diagnosis not present

## 2014-06-26 DIAGNOSIS — M6281 Muscle weakness (generalized): Secondary | ICD-10-CM | POA: Diagnosis not present

## 2014-06-26 DIAGNOSIS — R269 Unspecified abnormalities of gait and mobility: Secondary | ICD-10-CM | POA: Diagnosis not present

## 2014-06-26 DIAGNOSIS — D509 Iron deficiency anemia, unspecified: Secondary | ICD-10-CM

## 2014-06-26 DIAGNOSIS — I5032 Chronic diastolic (congestive) heart failure: Secondary | ICD-10-CM | POA: Diagnosis not present

## 2014-06-26 DIAGNOSIS — E440 Moderate protein-calorie malnutrition: Secondary | ICD-10-CM | POA: Diagnosis not present

## 2014-06-26 DIAGNOSIS — I5042 Chronic combined systolic (congestive) and diastolic (congestive) heart failure: Secondary | ICD-10-CM | POA: Diagnosis not present

## 2014-06-26 LAB — CBC WITH DIFFERENTIAL/PLATELET
Basophils Absolute: 0 10*3/uL (ref 0.0–0.1)
Basophils Relative: 0.6 % (ref 0.0–3.0)
EOS ABS: 0 10*3/uL (ref 0.0–0.7)
EOS PCT: 1 % (ref 0.0–5.0)
HEMATOCRIT: 32.5 % — AB (ref 36.0–46.0)
HEMOGLOBIN: 10.4 g/dL — AB (ref 12.0–15.0)
LYMPHS ABS: 1.3 10*3/uL (ref 0.7–4.0)
Lymphocytes Relative: 27.4 % (ref 12.0–46.0)
MCHC: 32.1 g/dL (ref 30.0–36.0)
MCV: 81.7 fl (ref 78.0–100.0)
MONO ABS: 0.4 10*3/uL (ref 0.1–1.0)
MONOS PCT: 7.8 % (ref 3.0–12.0)
Neutro Abs: 3.1 10*3/uL (ref 1.4–7.7)
Neutrophils Relative %: 63.2 % (ref 43.0–77.0)
Platelets: 286 10*3/uL (ref 150.0–400.0)
RBC: 3.97 Mil/uL (ref 3.87–5.11)
RDW: 20.6 % — AB (ref 11.5–15.5)
WBC: 4.9 10*3/uL (ref 4.0–10.5)

## 2014-06-26 MED ORDER — GLUCOSE BLOOD VI STRP: 1.0000 | ORAL_STRIP | Status: DC | PRN

## 2014-06-26 NOTE — Progress Notes (Signed)
Subjective:    Patient ID: Susan Browning, female    DOB: 1920/11/16, 81 y.o.   MRN: [id number]  HPI Pt returns for f/u of diabetes mellitus: DM type: 2 Dx'ed: 6222 Complications: none Therapy: 2 oral meds GDM: never DKA: never Severe hypoglycemia: never Pancreatitis: never Other: she has never been on insulin Interval history: She denies hypoglycemia.  She does not have a cbg meter. Edema: she denies sob Anemia: she was recently in the hospital for this: she denies BRBPR. Past Medical History  Diagnosis Date  . DIABETES MELLITUS, TYPE II 01/03/2007  . HYPERCHOLESTEROLEMIA 06/01/2009  . ANEMIA, IRON DEFICIENCY 08/15/2007  . LEUKOPENIA, CHRONIC 06/01/2009  . HYPERTENSION 01/03/2007  . ALLERGIC RHINITIS 01/03/2007  . OSTEOARTHRITIS 01/03/2007  . SHOULDER PAIN, LEFT 02/18/2008  . OSTEOPOROSIS 01/03/2007  . NONSPECIFIC ABNORMAL ELECTROCARDIOGRAM 02/12/2007  . COLONIC POLYPS, HX OF 01/03/2007  . Diabetic retinopathy   . Smoker   . OA (osteoarthritis)     Past Surgical History  Procedure Laterality Date  . Child birth      x's 5  . Dilation and curettage of uterus      History   Social History  . Marital Status: Married    Spouse Name: N/A  . Number of Children: N/A  . Years of Education: N/A   Occupational History  . Not on file.   Social History Main Topics  . Smoking status: Current Every Day Smoker -- 0.50 packs/day    Types: Cigarettes  . Smokeless tobacco: Not on file  . Alcohol Use: No  . Drug Use: No  . Sexual Activity: Not on file   Other Topics Concern  . Not on file   Social History Narrative    Current Outpatient Prescriptions on File Prior to Visit  Medication Sig Dispense Refill  . donepezil (ARICEPT) 5 MG tablet Take 1 tablet (5 mg total) by mouth at bedtime. 30 tablet 11  . feeding supplement, ENSURE COMPLETE, (ENSURE COMPLETE) LIQD Take 237 mLs by mouth 2 (two) times daily between meals.    . furosemide (LASIX) 20 MG tablet Take 1 tablet (20  mg total) by mouth daily. 30 tablet 3  . lovastatin (MEVACOR) 40 MG tablet TAKE 2 TABLETS (80 MG TOTAL) BY MOUTH AT BEDTIME. 60 tablet 0  . metFORMIN (GLUCOPHAGE) 1000 MG tablet TAKE 1 TABLET (1,000 MG TOTAL) BY MOUTH 2 (TWO) TIMES DAILY WITH A MEAL. 180 tablet 1  . repaglinide (PRANDIN) 0.5 MG tablet Take 1 tablet (0.5 mg total) by mouth 3 (three) times daily before meals. 90 tablet 11   No current facility-administered medications on file prior to visit.    Allergies  Allergen Reactions  . Penicillins     Family History  Problem Relation Age of Onset  . Diabetes Sister   . Liver disease Daughter     Cirrhosis-transplant  . Colon cancer Neg Hx   . Breast cancer Maternal Aunt     BP 130/80 mmHg  Pulse 92  Temp(Src) 98.5 F (36.9 C) (Oral)  SpO2 94%   Review of Systems Denies hematuria.  She has rhinorrhea    Objective:   Physical Exam Vital signs: see vs page Gen: elderly, frail, no distress Ext: no edema      Assessment & Plan:  Allergic rhinitis, new Anemia, clinically better DM: she should check cbg's  Patient is advised the following: Patient Instructions  Here is a new meter. i have sent a prescription to your pharmacy, for strips.  blood tests are being requested for you today.  We'll let you know about the results. check your blood sugar once a day.  vary the time of day when you check, between before the 3 meals, and at bedtime.  also check if you have symptoms of your blood sugar being too high or too low.  please keep a record of the readings and bring it to your next appointment here.  You can write it on any piece of paper.  please call us sooner if your blood sugar goes below 70, or if you have a lot of readings over 200.  Please come back for a follow-up appointment in 1 month.  Loratadine (non-prescription) will help your watery eyes and runny nose.

## 2014-06-26 NOTE — Patient Instructions (Addendum)
Here is a new meter. i have sent a prescription to your pharmacy, for strips. blood tests are being requested for you today.  We'll let you know about the results. check your blood sugar once a day.  vary the time of day when you check, between before the 3 meals, and at bedtime.  also check if you have symptoms of your blood sugar being too high or too low.  please keep a record of the readings and bring it to your next appointment here.  You can write it on any piece of paper.  please call us sooner if your blood sugar goes below 70, or if you have a lot of readings over 200.  Please come back for a follow-up appointment in 1 month.  Loratadine (non-prescription) will help your watery eyes and runny nose.

## 2014-06-29 DIAGNOSIS — I5042 Chronic combined systolic (congestive) and diastolic (congestive) heart failure: Secondary | ICD-10-CM | POA: Diagnosis not present

## 2014-06-29 DIAGNOSIS — M6281 Muscle weakness (generalized): Secondary | ICD-10-CM | POA: Diagnosis not present

## 2014-06-29 DIAGNOSIS — D649 Anemia, unspecified: Secondary | ICD-10-CM | POA: Diagnosis not present

## 2014-06-29 DIAGNOSIS — I10 Essential (primary) hypertension: Secondary | ICD-10-CM | POA: Diagnosis not present

## 2014-06-29 DIAGNOSIS — I5032 Chronic diastolic (congestive) heart failure: Secondary | ICD-10-CM | POA: Diagnosis not present

## 2014-06-29 DIAGNOSIS — R2689 Other abnormalities of gait and mobility: Secondary | ICD-10-CM | POA: Diagnosis not present

## 2014-06-29 DIAGNOSIS — E440 Moderate protein-calorie malnutrition: Secondary | ICD-10-CM | POA: Diagnosis not present

## 2014-06-29 DIAGNOSIS — R269 Unspecified abnormalities of gait and mobility: Secondary | ICD-10-CM | POA: Diagnosis not present

## 2014-06-29 LAB — IBC PANEL
IRON: 34 ug/dL — AB (ref 42–145)
Saturation Ratios: 7.1 % — ABNORMAL LOW (ref 20.0–50.0)
TRANSFERRIN: 341 mg/dL (ref 212.0–360.0)

## 2014-06-29 LAB — BASIC METABOLIC PANEL
BUN: 13 mg/dL (ref 6–23)
CALCIUM: 9 mg/dL (ref 8.4–10.5)
CO2: 30 mEq/L (ref 19–32)
Chloride: 105 mEq/L (ref 96–112)
Creatinine, Ser: 0.71 mg/dL (ref 0.40–1.20)
GFR: 99.13 mL/min (ref >60.00)
GLUCOSE: 54 mg/dL — AB (ref 70–99)
Potassium: 4.3 mEq/L (ref 3.5–5.1)
Sodium: 141 mEq/L (ref 135–145)

## 2014-07-01 DIAGNOSIS — I10 Essential (primary) hypertension: Secondary | ICD-10-CM | POA: Diagnosis not present

## 2014-07-01 DIAGNOSIS — R269 Unspecified abnormalities of gait and mobility: Secondary | ICD-10-CM | POA: Diagnosis not present

## 2014-07-01 DIAGNOSIS — I5042 Chronic combined systolic (congestive) and diastolic (congestive) heart failure: Secondary | ICD-10-CM | POA: Diagnosis not present

## 2014-07-01 DIAGNOSIS — M6281 Muscle weakness (generalized): Secondary | ICD-10-CM | POA: Diagnosis not present

## 2014-07-01 DIAGNOSIS — E440 Moderate protein-calorie malnutrition: Secondary | ICD-10-CM | POA: Diagnosis not present

## 2014-07-01 DIAGNOSIS — D649 Anemia, unspecified: Secondary | ICD-10-CM | POA: Diagnosis not present

## 2014-07-02 DIAGNOSIS — D649 Anemia, unspecified: Secondary | ICD-10-CM | POA: Diagnosis not present

## 2014-07-02 DIAGNOSIS — R269 Unspecified abnormalities of gait and mobility: Secondary | ICD-10-CM | POA: Diagnosis not present

## 2014-07-02 DIAGNOSIS — I5042 Chronic combined systolic (congestive) and diastolic (congestive) heart failure: Secondary | ICD-10-CM | POA: Diagnosis not present

## 2014-07-02 DIAGNOSIS — E440 Moderate protein-calorie malnutrition: Secondary | ICD-10-CM | POA: Diagnosis not present

## 2014-07-02 DIAGNOSIS — I10 Essential (primary) hypertension: Secondary | ICD-10-CM | POA: Diagnosis not present

## 2014-07-02 DIAGNOSIS — M6281 Muscle weakness (generalized): Secondary | ICD-10-CM | POA: Diagnosis not present

## 2014-07-03 ENCOUNTER — Other Ambulatory Visit (HOSPITAL_COMMUNITY): Payer: Medicare Other

## 2014-07-04 ENCOUNTER — Other Ambulatory Visit: Payer: Self-pay | Admitting: Endocrinology

## 2014-07-06 DIAGNOSIS — I5042 Chronic combined systolic (congestive) and diastolic (congestive) heart failure: Secondary | ICD-10-CM | POA: Diagnosis not present

## 2014-07-06 DIAGNOSIS — D649 Anemia, unspecified: Secondary | ICD-10-CM | POA: Diagnosis not present

## 2014-07-06 DIAGNOSIS — I10 Essential (primary) hypertension: Secondary | ICD-10-CM | POA: Diagnosis not present

## 2014-07-06 DIAGNOSIS — M6281 Muscle weakness (generalized): Secondary | ICD-10-CM | POA: Diagnosis not present

## 2014-07-06 DIAGNOSIS — R269 Unspecified abnormalities of gait and mobility: Secondary | ICD-10-CM | POA: Diagnosis not present

## 2014-07-06 DIAGNOSIS — E440 Moderate protein-calorie malnutrition: Secondary | ICD-10-CM | POA: Diagnosis not present

## 2014-07-09 DIAGNOSIS — E440 Moderate protein-calorie malnutrition: Secondary | ICD-10-CM | POA: Diagnosis not present

## 2014-07-09 DIAGNOSIS — I5042 Chronic combined systolic (congestive) and diastolic (congestive) heart failure: Secondary | ICD-10-CM | POA: Diagnosis not present

## 2014-07-09 DIAGNOSIS — M6281 Muscle weakness (generalized): Secondary | ICD-10-CM | POA: Diagnosis not present

## 2014-07-09 DIAGNOSIS — D649 Anemia, unspecified: Secondary | ICD-10-CM | POA: Diagnosis not present

## 2014-07-09 DIAGNOSIS — R269 Unspecified abnormalities of gait and mobility: Secondary | ICD-10-CM | POA: Diagnosis not present

## 2014-07-09 DIAGNOSIS — I10 Essential (primary) hypertension: Secondary | ICD-10-CM | POA: Diagnosis not present

## 2014-07-17 ENCOUNTER — Other Ambulatory Visit (HOSPITAL_COMMUNITY): Payer: Medicare Other

## 2014-07-21 ENCOUNTER — Other Ambulatory Visit: Payer: Self-pay | Admitting: Endocrinology

## 2014-08-03 ENCOUNTER — Other Ambulatory Visit: Payer: Self-pay | Admitting: Endocrinology

## 2014-08-31 ENCOUNTER — Other Ambulatory Visit: Payer: Self-pay | Admitting: Endocrinology

## 2014-09-02 ENCOUNTER — Telehealth: Payer: Self-pay | Admitting: Endocrinology

## 2014-09-02 MED ORDER — ALPRAZOLAM 0.25 MG PO TABS: ORAL_TABLET | ORAL | Status: DC

## 2014-09-02 NOTE — Telephone Encounter (Signed)
Left voicemail advising the patients daughter a Rx for Xanax has been called into her pharmacy. Requested call back if patient or pt's daughter would like to discuss .

## 2014-09-02 NOTE — Telephone Encounter (Signed)
i printed 

## 2014-09-02 NOTE — Telephone Encounter (Signed)
Patient's daughter Susan Browning called and would like for Susan Browning to please have anxiety medication called into her pharmacy  Susan Browning's husband had passed away and she needs something to calm her   Please advise   Thank you   Call back: Susan Browning (daughter) [phone]

## 2014-09-02 NOTE — Telephone Encounter (Signed)
See note below and please advise, Thanks! 

## 2014-09-09 ENCOUNTER — Ambulatory Visit (INDEPENDENT_AMBULATORY_CARE_PROVIDER_SITE_OTHER): Payer: Medicare Other | Admitting: Endocrinology

## 2014-09-09 VITALS — BP 118/58 | HR 87 | Temp 98.1°F | Ht 64.0 in | Wt 109.0 lb

## 2014-09-09 DIAGNOSIS — E119 Type 2 diabetes mellitus without complications: Secondary | ICD-10-CM

## 2014-09-09 DIAGNOSIS — D509 Iron deficiency anemia, unspecified: Secondary | ICD-10-CM

## 2014-09-09 DIAGNOSIS — M25512 Pain in left shoulder: Secondary | ICD-10-CM

## 2014-09-09 LAB — IBC PANEL
Iron: 30 ug/dL — ABNORMAL LOW (ref 42–145)
SATURATION RATIOS: 6.8 % — AB (ref 20.0–50.0)
TRANSFERRIN: 314 mg/dL (ref 212.0–360.0)

## 2014-09-09 LAB — HEMOGLOBIN A1C: Hgb A1c MFr Bld: 6.3 % (ref 4.6–6.5)

## 2014-09-09 MED ORDER — FLUTICASONE-SALMETEROL 100-50 MCG/DOSE IN AEPB: 1.0000 | INHALATION_SPRAY | Freq: Two times a day (BID) | RESPIRATORY_TRACT | Status: DC

## 2014-09-09 MED ORDER — DOXYCYCLINE HYCLATE 50 MG PO CAPS: 50.0000 mg | ORAL_CAPSULE | Freq: Two times a day (BID) | ORAL | Status: DC

## 2014-09-09 NOTE — Patient Instructions (Addendum)
i have sent 2 prescriptions to your pharmacy: for an antibiotic pill, and an inhaler Please continue the same medication as needed for the anxiety or sleep. Please see a specialist for your shoulder.  you will receive a phone call, about a day and time for an appointment.  blood tests are requested for you today.  We'll let you know about the results. You can stop taking the lovastatin. Please come back for a follow-up appointment in 3 months.

## 2014-09-09 NOTE — Progress Notes (Signed)
Subjective:    Patient ID: Susan Browning, female    DOB: November 07, 1920, 81 y.o.   MRN: [id number]  HPI  Pt returns for f/u of diabetes mellitus: DM type: 2 Dx'ed: 7948 Complications: none Therapy: metformin GDM: never DKA: never Severe hypoglycemia: never Pancreatitis: never Other: she has never taken insulin Interval history: no cbg record, but dtr states cbg's are in the 100's Pt states few days of slight nodule at the right ext ear, and assoc drainage. Hx is from pt and dtr.  pt recently suffered the death of her husband.  i sent rx for xanax--this helped. She does not take fe tabs. Past Medical History  Diagnosis Date  . DIABETES MELLITUS, TYPE II 01/03/2007  . HYPERCHOLESTEROLEMIA 06/01/2009  . ANEMIA, IRON DEFICIENCY 08/15/2007  . LEUKOPENIA, CHRONIC 06/01/2009  . HYPERTENSION 01/03/2007  . ALLERGIC RHINITIS 01/03/2007  . OSTEOARTHRITIS 01/03/2007  . SHOULDER PAIN, LEFT 02/18/2008  . OSTEOPOROSIS 01/03/2007  . NONSPECIFIC ABNORMAL ELECTROCARDIOGRAM 02/12/2007  . COLONIC POLYPS, HX OF 01/03/2007  . Diabetic retinopathy   . Smoker   . OA (osteoarthritis)     Past Surgical History  Procedure Laterality Date  . Child birth      x's 5  . Dilation and curettage of uterus      History   Social History  . Marital Status: Married    Spouse Name: N/A  . Number of Children: N/A  . Years of Education: N/A   Occupational History  . Not on file.   Social History Main Topics  . Smoking status: Current Every Day Smoker -- 0.50 packs/day    Types: Cigarettes  . Smokeless tobacco: Not on file  . Alcohol Use: No  . Drug Use: No  . Sexual Activity: Not on file   Other Topics Concern  . Not on file   Social History Narrative    Current Outpatient Prescriptions on File Prior to Visit  Medication Sig Dispense Refill  . ALPRAZolam (XANAX) 0.25 MG tablet 1/2-1 tab every 8 hrs as needed for anxiety or sleep 20 tablet 0  . feeding supplement, ENSURE COMPLETE, (ENSURE  COMPLETE) LIQD Take 237 mLs by mouth 2 (two) times daily between meals.    . furosemide (LASIX) 20 MG tablet Take 1 tablet (20 mg total) by mouth daily. 30 tablet 3  . glucose blood (ONETOUCH VERIO) test strip 1 each by Other route as needed for other. And lancets 1/day 100 each 12  . metFORMIN (GLUCOPHAGE) 1000 MG tablet TAKE 1 TABLET (1,000 MG TOTAL) BY MOUTH 2 (TWO) TIMES DAILY WITH A MEAL. 180 tablet 1   No current facility-administered medications on file prior to visit.    Allergies  Allergen Reactions  . Penicillins     Family History  Problem Relation Age of Onset  . Diabetes Sister   . Liver disease Daughter     Cirrhosis-transplant  . Colon cancer Neg Hx   . Breast cancer Maternal Aunt     BP 118/58 mmHg  Pulse 87  Temp(Src) 98.1 F (36.7 C) (Oral)  Ht 5\' 4"  (1.626 m)  Wt 109 lb (49.442 kg)  BMI 18.70 kg/m2  SpO2 94%    Review of Systems Chronic sob is slightly worse.  Denies fever.  Left shoulder pain persists    Objective:   Physical Exam VITAL SIGNS:  See vs page.  GENERAL: no distress. Right ext ear: 1 cm pustule LUNGS:  Clear to auscultation PSYCH: Alert and well-oriented.  Does not  appear anxious nor depressed.   Gait: slow but steady.   Lab Results  Component Value Date   HGBA1C 6.3 09/09/2014   Lab Results  Component Value Date   WBC 4.1 09/09/2014   HGB 10.0* 09/09/2014   HCT 31.5* 09/09/2014   MCV 86.1 09/09/2014   PLT 199.0 09/09/2014      Assessment & Plan:  DM: well-controlled Bereavement-related anxiety and insomnia, well-controlled.  Dyslipidemia: lovastatin is of questionable value at this advanced age Shoulder pain: persistent fe-deficiency anemia: she needs increased rx  Patient is advised the following: Patient Instructions  i have sent 2 prescriptions to your pharmacy: for an antibiotic pill, and an inhaler Please continue the same medication as needed for the anxiety or sleep. Please see a specialist for your  shoulder.  you will receive a phone call, about a day and time for an appointment.  blood tests are requested for you today.  We'll let you know about the results. You can stop taking the lovastatin. Please come back for a follow-up appointment in 3 months.  addendum: take fe 1-BID

## 2014-09-10 LAB — CBC WITH DIFFERENTIAL/PLATELET
Basophils Relative: 0 % (ref 0.0–3.0)
Eosinophils Relative: 0 % (ref 0.0–5.0)
HCT: 31.5 % — ABNORMAL LOW (ref 36.0–46.0)
Hemoglobin: 10 g/dL — ABNORMAL LOW (ref 12.0–15.0)
Lymphocytes Relative: 36 % (ref 12.0–46.0)
MCHC: 31.7 g/dL (ref 30.0–36.0)
MCV: 86.1 fl (ref 78.0–100.0)
Monocytes Relative: 10 % (ref 3.0–12.0)
Neutrophils Relative %: 54 % (ref 43.0–77.0)
Platelets: 199 10*3/uL (ref 150.0–400.0)
RBC: 3.67 Mil/uL — AB (ref 3.87–5.11)
RDW: 20.4 % — AB (ref 11.5–15.5)
WBC: 4.1 10*3/uL (ref 4.0–10.5)

## 2014-09-22 ENCOUNTER — Encounter: Payer: Self-pay | Admitting: Family Medicine

## 2014-09-22 ENCOUNTER — Ambulatory Visit (INDEPENDENT_AMBULATORY_CARE_PROVIDER_SITE_OTHER): Payer: Medicare Other | Admitting: Family Medicine

## 2014-09-22 VITALS — BP 116/80 | HR 94 | Ht 64.0 in | Wt 109.0 lb

## 2014-09-22 DIAGNOSIS — M129 Arthropathy, unspecified: Secondary | ICD-10-CM

## 2014-09-22 DIAGNOSIS — M19012 Primary osteoarthritis, left shoulder: Secondary | ICD-10-CM

## 2014-09-22 NOTE — Progress Notes (Signed)
Pre visit review using our clinic review tool, if applicable. No additional management support is needed unless otherwise documented below in the visit note. 

## 2014-09-22 NOTE — Assessment & Plan Note (Signed)
She does of severe osteophytic changes of the patient different treatment options and patient elected to have injection today. Patient has many other comorbidities and we discussed because ordered secondary to her diabetes to check her blood sugars one extra time daily for the next 3 days. We discussed icing regimen and patient given a very small dose of a topical anti-inflammatory to try. Discussed possible over-the-counter natural supplementations including vitamin D that could be helpful. Patient will avoid any other significant medications at this time secondary to patient's age and prescription medications. Patient and will come back and see me again in 2-3 months of repeat injection is necessary. Discussed with patient that this will be more of temporizing symptoms than true tear measures.

## 2014-09-22 NOTE — Patient Instructions (Addendum)
Good to see you Ice 20 minutes 2 times daily. Usually after activity and before bed. pennsaid pinkie amount topically 2 times daily as needed.  Tylenol 325mg  3 times daily can help Vitamin D 2000 IU daily  Glucosamine 1500mg  daily Keep doing what you are doing See me again in 10 weeks if you need another injection.

## 2014-09-22 NOTE — Progress Notes (Signed)
[facility] [place], [name] Phone: [phone] Subjective:    I'm seeing this patient by the request  of:  Susan Shin, MD   CC:  Left shoulder pain  [id number] Susan Browning is a 81 y.o. female coming in with complaint of  Left shoulder pain. Patient states that she has had some pain intermittently for quite some time but it seems to be worsening over the course of the last 9 months. Patient is having difficulty moving it more and more. Patient states that there is also a cracking sensation that can be very painful. Patient has had difficulty for quite some time. Patient though if she is trying to help lift herself out of her wheelchair can be very difficult secondary to the pain. Patient can also keep her up at night. Denies any radiation down the arm. Rates the severity of pain a 6 out of 10. Patient has many other comorbidities and wants to avoid  any type of surgery whatsoever. Patient states that her quality of life has decreased recently.  Past Medical History  Diagnosis Date  . DIABETES MELLITUS, TYPE II 01/03/2007  . HYPERCHOLESTEROLEMIA 06/01/2009  . ANEMIA, IRON DEFICIENCY 08/15/2007  . LEUKOPENIA, CHRONIC 06/01/2009  . HYPERTENSION 01/03/2007  . ALLERGIC RHINITIS 01/03/2007  . OSTEOARTHRITIS 01/03/2007  . SHOULDER PAIN, LEFT 02/18/2008  . OSTEOPOROSIS 01/03/2007  . NONSPECIFIC ABNORMAL ELECTROCARDIOGRAM 02/12/2007  . COLONIC POLYPS, HX OF 01/03/2007  . Diabetic retinopathy   . Smoker   . OA (osteoarthritis)    Past Surgical History  Procedure Laterality Date  . Child birth      x's 5  . Dilation and curettage of uterus     Allergies  Allergen Reactions  . Penicillins    Family History  Problem Relation Age of Onset  . Diabetes Sister   . Liver disease Daughter     Cirrhosis-transplant  . Colon cancer Neg Hx   . Breast cancer Maternal Aunt    History  Substance Use Topics  . Smoking status: Current  Every Day Smoker -- 0.50 packs/day    Types: Cigarettes  . Smokeless tobacco: Not on file  . Alcohol Use: No       Past medical history, social, surgical and family history all reviewed in electronic medical record.   Review of Systems: No headache, visual changes, nausea, vomiting, diarrhea, constipation, dizziness, abdominal pain, skin rash, fevers, chills, night sweats, weight loss, swollen lymph nodes, body aches, joint swelling, muscle aches, chest pain, shortness of breath, mood changes.   Objective Blood pressure 116/80, pulse 94, height 5\' 4"  (1.626 m), weight 109 lb (49.442 kg), SpO2 91 %.  General: No apparent distress alert and oriented x3 mood and affect normal, dressed appropriately. Very frail.  HEENT: Pupils equal, extraocular movements intact  Respiratory: Patient's speak in full sentences and does not appear short of breath  Cardiovascular: No lower extremity edema, non tender, no erythema  Skin: Warm dry intact with no signs of infection or rash on extremities or on axial skeleton.  Abdomen: Soft nontender  Neuro: Cranial nerves II through XII are intact, neurovascularly intact in all extremities with 2+ DTRs and 2+ pulses.  Lymph: No lymphadenopathy of posterior or anterior cervical chain or axillae bilaterally.  Gait normal with good balance and coordination.  MSK:  Non tender with full range of motion and good stability and symmetric strength and tone of , elbows, wrist, hip, knee and ankles bilaterally.  Significant osteophytic changes multiple joints.  Shoulder:left Inspection reveals living and atrophy of the musculature of the left shoulder as well as the right shoulder. Patient is mildly cachectic Palpation is normal with no tenderness over AC joint or bicipital groove. Severe decrease in range of motion actively and passively secondary to bone-on-bone with significant crepitus. Severe signs of impingement with negative Neer and Hawkin's tests,an positive empty  can sign. Speeds and Yergason's tests normal. Normal scapular function observed. No painful arc and no drop arm sign. No apprehension sign   Procedure note After informed written and verbal consent, patient was seated on exam table. Left shoulder was prepped with alcohol swab and utilizing posterior approach, patient's right glenohumeral space was injected with 4:0.5cc  marcaine 0.5%: Kenalog 40mg /dL. Patient tolerated the procedure well without immediate complications.    Impression and Recommendations:     This case required medical decision making of moderate complexity.

## 2014-09-28 ENCOUNTER — Ambulatory Visit: Payer: Medicare Other | Admitting: Family Medicine

## 2014-10-13 ENCOUNTER — Encounter (HOSPITAL_COMMUNITY): Payer: Self-pay | Admitting: Emergency Medicine

## 2014-10-13 ENCOUNTER — Emergency Department (HOSPITAL_COMMUNITY): Payer: Medicare Other

## 2014-10-13 ENCOUNTER — Inpatient Hospital Stay (HOSPITAL_COMMUNITY)
Admission: EM | Admit: 2014-10-13 | Discharge: 2014-10-19 | DRG: 291 | Disposition: A | Discharge status: Hospice | Payer: Medicare Other | Attending: Family Medicine | Admitting: Family Medicine

## 2014-10-13 DIAGNOSIS — F17210 Nicotine dependence, cigarettes, uncomplicated: Secondary | ICD-10-CM | POA: Diagnosis present

## 2014-10-13 DIAGNOSIS — E119 Type 2 diabetes mellitus without complications: Secondary | ICD-10-CM

## 2014-10-13 DIAGNOSIS — Z880 Allergy status to penicillin: Secondary | ICD-10-CM

## 2014-10-13 DIAGNOSIS — D509 Iron deficiency anemia, unspecified: Secondary | ICD-10-CM | POA: Diagnosis not present

## 2014-10-13 DIAGNOSIS — I10 Essential (primary) hypertension: Secondary | ICD-10-CM | POA: Diagnosis not present

## 2014-10-13 DIAGNOSIS — J45901 Unspecified asthma with (acute) exacerbation: Secondary | ICD-10-CM

## 2014-10-13 DIAGNOSIS — Z833 Family history of diabetes mellitus: Secondary | ICD-10-CM | POA: Diagnosis not present

## 2014-10-13 DIAGNOSIS — E780 Pure hypercholesterolemia: Secondary | ICD-10-CM | POA: Diagnosis present

## 2014-10-13 DIAGNOSIS — Z515 Encounter for palliative care: Secondary | ICD-10-CM | POA: Insufficient documentation

## 2014-10-13 DIAGNOSIS — E872 Acidosis, unspecified: Secondary | ICD-10-CM

## 2014-10-13 DIAGNOSIS — R0602 Shortness of breath: Secondary | ICD-10-CM | POA: Diagnosis not present

## 2014-10-13 DIAGNOSIS — J690 Pneumonitis due to inhalation of food and vomit: Secondary | ICD-10-CM | POA: Diagnosis present

## 2014-10-13 DIAGNOSIS — J9602 Acute respiratory failure with hypercapnia: Secondary | ICD-10-CM

## 2014-10-13 DIAGNOSIS — J9691 Respiratory failure, unspecified with hypoxia: Secondary | ICD-10-CM

## 2014-10-13 DIAGNOSIS — Z7982 Long term (current) use of aspirin: Secondary | ICD-10-CM

## 2014-10-13 DIAGNOSIS — J9622 Acute and chronic respiratory failure with hypercapnia: Secondary | ICD-10-CM | POA: Diagnosis present

## 2014-10-13 DIAGNOSIS — E11319 Type 2 diabetes mellitus with unspecified diabetic retinopathy without macular edema: Secondary | ICD-10-CM | POA: Diagnosis present

## 2014-10-13 DIAGNOSIS — I5032 Chronic diastolic (congestive) heart failure: Secondary | ICD-10-CM | POA: Diagnosis present

## 2014-10-13 DIAGNOSIS — I5043 Acute on chronic combined systolic (congestive) and diastolic (congestive) heart failure: Principal | ICD-10-CM | POA: Diagnosis present

## 2014-10-13 DIAGNOSIS — J9601 Acute respiratory failure with hypoxia: Secondary | ICD-10-CM | POA: Diagnosis not present

## 2014-10-13 DIAGNOSIS — M810 Age-related osteoporosis without current pathological fracture: Secondary | ICD-10-CM | POA: Diagnosis present

## 2014-10-13 DIAGNOSIS — J9621 Acute and chronic respiratory failure with hypoxia: Secondary | ICD-10-CM | POA: Diagnosis present

## 2014-10-13 DIAGNOSIS — J449 Chronic obstructive pulmonary disease, unspecified: Secondary | ICD-10-CM | POA: Diagnosis not present

## 2014-10-13 DIAGNOSIS — I5033 Acute on chronic diastolic (congestive) heart failure: Secondary | ICD-10-CM | POA: Diagnosis present

## 2014-10-13 DIAGNOSIS — J9600 Acute respiratory failure, unspecified whether with hypoxia or hypercapnia: Secondary | ICD-10-CM

## 2014-10-13 DIAGNOSIS — R0600 Dyspnea, unspecified: Secondary | ICD-10-CM | POA: Diagnosis not present

## 2014-10-13 DIAGNOSIS — J441 Chronic obstructive pulmonary disease with (acute) exacerbation: Secondary | ICD-10-CM | POA: Diagnosis present

## 2014-10-13 DIAGNOSIS — Z79899 Other long term (current) drug therapy: Secondary | ICD-10-CM | POA: Diagnosis not present

## 2014-10-13 DIAGNOSIS — J9692 Respiratory failure, unspecified with hypercapnia: Secondary | ICD-10-CM

## 2014-10-13 DIAGNOSIS — D649 Anemia, unspecified: Secondary | ICD-10-CM | POA: Diagnosis present

## 2014-10-13 DIAGNOSIS — J189 Pneumonia, unspecified organism: Secondary | ICD-10-CM

## 2014-10-13 DIAGNOSIS — R413 Other amnesia: Secondary | ICD-10-CM | POA: Diagnosis present

## 2014-10-13 LAB — COMPREHENSIVE METABOLIC PANEL
ALT: 38 U/L (ref 14–54)
ANION GAP: 9 (ref 5–15)
AST: 78 U/L — AB (ref 15–41)
Albumin: 3.5 g/dL (ref 3.5–5.0)
Alkaline Phosphatase: 100 U/L (ref 38–126)
BUN: 14 mg/dL (ref 6–20)
CO2: 28 mmol/L (ref 22–32)
Calcium: 8 mg/dL — ABNORMAL LOW (ref 8.9–10.3)
Chloride: 104 mmol/L (ref 101–111)
Creatinine, Ser: 0.71 mg/dL (ref 0.44–1.00)
GFR calc Af Amer: >60 mL/min (ref >60)
GFR calc non Af Amer: >60 mL/min (ref >60)
Glucose, Bld: 105 mg/dL — ABNORMAL HIGH (ref 65–99)
POTASSIUM: 3.5 mmol/L (ref 3.5–5.1)
Sodium: 141 mmol/L (ref 135–145)
TOTAL PROTEIN: 6.7 g/dL (ref 6.5–8.1)
Total Bilirubin: 0.7 mg/dL (ref 0.3–1.2)

## 2014-10-13 LAB — I-STAT ARTERIAL BLOOD GAS, ED
Acid-Base Excess: 3 mmol/L — ABNORMAL HIGH (ref 0.0–2.0)
Bicarbonate: 30.5 mEq/L — ABNORMAL HIGH (ref 20.0–24.0)
O2 SAT: 99 %
TCO2: 32 mmol/L (ref 0–100)
pCO2 arterial: 60.2 mmHg (ref 35.0–45.0)
pH, Arterial: 7.313 — ABNORMAL LOW (ref 7.350–7.450)
pO2, Arterial: 134 mmHg — ABNORMAL HIGH (ref 80.0–100.0)

## 2014-10-13 LAB — CBC
HCT: 36.5 % (ref 36.0–46.0)
HEMOGLOBIN: 11.5 g/dL — AB (ref 12.0–15.0)
MCH: 29.3 pg (ref 26.0–34.0)
MCHC: 31.5 g/dL (ref 30.0–36.0)
MCV: 92.9 fL (ref 78.0–100.0)
Platelets: 177 10*3/uL (ref 150–400)
RBC: 3.93 MIL/uL (ref 3.87–5.11)
RDW: 19.7 % — ABNORMAL HIGH (ref 11.5–15.5)
WBC: 6.1 10*3/uL (ref 4.0–10.5)

## 2014-10-13 LAB — BRAIN NATRIURETIC PEPTIDE: B Natriuretic Peptide: 3407.8 pg/mL — ABNORMAL HIGH (ref 0.0–100.0)

## 2014-10-13 LAB — I-STAT CG4 LACTIC ACID, ED: LACTIC ACID, VENOUS: 0.89 mmol/L (ref 0.5–2.0)

## 2014-10-13 MED ORDER — SODIUM CHLORIDE 0.9 % IV BOLUS (SEPSIS)
1000.0000 mL | Freq: Once | INTRAVENOUS | Status: AC
  Administered 2014-10-13: 1000 mL via INTRAVENOUS

## 2014-10-13 MED ORDER — METHYLPREDNISOLONE SODIUM SUCC 125 MG IJ SOLR
125.0000 mg | Freq: Once | INTRAMUSCULAR | Status: AC
  Administered 2014-10-13: 125 mg via INTRAVENOUS
  Filled 2014-10-13: qty 2

## 2014-10-13 MED ORDER — ALBUTEROL (5 MG/ML) CONTINUOUS INHALATION SOLN
INHALATION_SOLUTION | RESPIRATORY_TRACT | Status: AC
  Administered 2014-10-13: 23:00:00
  Filled 2014-10-13: qty 20

## 2014-10-13 NOTE — ED Notes (Signed)
Patient found by EMS in tripod position, diaphoretic and in respiratory distress.  Patient was placed on CPAP by EMS.  Initial stats in the 70's, unable to speak in complete sentences.  Hypertensive en route to ED.

## 2014-10-13 NOTE — ED Provider Notes (Signed)
CSN: [id number]     Arrival date & time 10/13/14  2218 History   First MD Initiated Contact with Patient 10/13/14 2227     Chief Complaint  Patient presents with  . Respiratory Distress   (Consider location/radiation/quality/duration/timing/severity/associated sxs/prior Treatment) Patient is a 81 y.o. female presenting with shortness of breath. The history is provided by the patient and the EMS personnel. No language interpreter was used.  Shortness of Breath Severity:  Severe Onset quality:  Gradual Timing:  Constant Progression:  Worsening Chronicity:  Recurrent Context: URI   Relieved by:  Nothing Worsened by:  Activity Ineffective treatments:  Inhaler Associated symptoms: wheezing   Associated symptoms: no abdominal pain, no chest pain, no cough, no fever, no headaches, no sputum production and no vomiting     Past Medical History  Diagnosis Date  . DIABETES MELLITUS, TYPE II 01/03/2007  . HYPERCHOLESTEROLEMIA 06/01/2009  . ANEMIA, IRON DEFICIENCY 08/15/2007  . LEUKOPENIA, CHRONIC 06/01/2009  . HYPERTENSION 01/03/2007  . ALLERGIC RHINITIS 01/03/2007  . OSTEOARTHRITIS 01/03/2007  . SHOULDER PAIN, LEFT 02/18/2008  . OSTEOPOROSIS 01/03/2007  . NONSPECIFIC ABNORMAL ELECTROCARDIOGRAM 02/12/2007  . COLONIC POLYPS, HX OF 01/03/2007  . Diabetic retinopathy   . Smoker   . OA (osteoarthritis)    Past Surgical History  Procedure Laterality Date  . Child birth      x's 5  . Dilation and curettage of uterus     Family History  Problem Relation Age of Onset  . Diabetes Sister   . Liver disease Daughter     Cirrhosis-transplant  . Colon cancer Neg Hx   . Breast cancer Maternal Aunt    History  Substance Use Topics  . Smoking status: Current Every Day Smoker -- 0.50 packs/day    Types: Cigarettes  . Smokeless tobacco: Not on file  . Alcohol Use: No   OB History    No data available     Review of Systems  Constitutional: Negative for fever and fatigue.  Respiratory: Positive  for shortness of breath and wheezing. Negative for cough, sputum production and chest tightness.   Cardiovascular: Negative for chest pain and palpitations.  Gastrointestinal: Negative for nausea, vomiting and abdominal pain.  Neurological: Negative for headaches.  Psychiatric/Behavioral: Negative for confusion.  All other systems reviewed and are negative.     Allergies  Penicillins  Home Medications   Prior to Admission medications   Medication Sig Start Date End Date Taking? Authorizing Provider  ALPRAZolam Duanne Moron) 0.25 MG tablet 1/2-1 tab every 8 hrs as needed for anxiety or sleep 09/02/14   Renato Shin, MD  doxycycline (VIBRAMYCIN) 50 MG capsule Take 1 capsule (50 mg total) by mouth 2 (two) times daily. 09/09/14   Renato Shin, MD  feeding supplement, ENSURE COMPLETE, (ENSURE COMPLETE) LIQD Take 237 mLs by mouth 2 (two) times daily between meals. 06/15/14   Eugenie Filler, MD  Fluticasone-Salmeterol (ADVAIR DISKUS) 100-50 MCG/DOSE AEPB Inhale 1 puff into the lungs 2 (two) times daily. 09/09/14   Renato Shin, MD  furosemide (LASIX) 20 MG tablet Take 1 tablet (20 mg total) by mouth daily. 06/12/14   Renato Shin, MD  glucose blood (ONETOUCH VERIO) test strip 1 each by Other route as needed for other. And lancets 1/day 06/26/14   Renato Shin, MD  lovastatin (MEVACOR) 40 MG tablet  08/31/14   Historical Provider, MD  metFORMIN (GLUCOPHAGE) 1000 MG tablet TAKE 1 TABLET (1,000 MG TOTAL) BY MOUTH 2 (TWO) TIMES DAILY WITH A MEAL. 05/18/14  Renato Shin, MD   ED Triage Vitals  Enc Vitals Group     BP 10/13/14 2230 148/77 mmHg     Pulse Rate 10/13/14 2230 54     Resp 10/13/14 2230 38     Temp 10/14/14 0145 97.6 F (36.4 C)     Temp Source 10/14/14 0145 Oral     SpO2 10/13/14 2230 100 %     Weight 10/14/14 0341 113 lb 5.1 oz (51.4 kg)     Height 10/14/14 0145 5\' 2"  (1.575 m)     Head Cir --      Peak Flow --      Pain Score 10/14/14 2132 8     Pain Loc --      Pain Edu? --      Excl. in  [place]? --    Physical Exam  Constitutional: She is oriented to person, place, and time. She appears well-developed and well-nourished. She appears toxic. She appears distressed. Face mask in place.  HENT:  Head: Normocephalic and atraumatic.  Nose: Nose normal.  Mouth/Throat: Oropharynx is clear and moist. No oropharyngeal exudate.  Eyes: EOM are normal. Pupils are equal, round, and reactive to light.  Neck: Normal range of motion. Neck supple.  Cardiovascular: Normal rate, regular rhythm, normal heart sounds and intact distal pulses.   No murmur heard. Pulmonary/Chest: She is in respiratory distress. She has no wheezes. She exhibits no tenderness.  Significantly decreased aeration bilaterally.  Increased WOB and retractions.  BiPAP mask on   Abdominal: Soft. There is no tenderness. There is no rebound and no guarding.  Musculoskeletal: Normal range of motion. She exhibits no tenderness.  Lymphadenopathy:    She has no cervical adenopathy.  Neurological: She is alert and oriented to person, place, and time. No cranial nerve deficit. Coordination normal.  Skin: Skin is warm and dry. She is not diaphoretic.  Psychiatric: She has a normal mood and affect. Her behavior is normal. Judgment and thought content normal.  Nursing note and vitals reviewed.   ED Course  Procedures (including critical care time) Labs Review Labs Reviewed  CBC - Abnormal; Notable for the following:    Hemoglobin 11.5 (*)    RDW 19.7 (*)    All other components within normal limits  BRAIN NATRIURETIC PEPTIDE - Abnormal; Notable for the following:    B Natriuretic Peptide 3407.8 (*)    All other components within normal limits  COMPREHENSIVE METABOLIC PANEL - Abnormal; Notable for the following:    Glucose, Bld 105 (*)    Calcium 8.0 (*)    AST 78 (*)    All other components within normal limits  I-STAT ARTERIAL BLOOD GAS, ED - Abnormal; Notable for the following:    pH, Arterial 7.313 (*)    pCO2 arterial  60.2 (*)    pO2, Arterial 134.0 (*)    Bicarbonate 30.5 (*)    Acid-Base Excess 3.0 (*)    All other components within normal limits  BLOOD GAS, ARTERIAL  I-STAT TROPOININ, ED  I-STAT CG4 LACTIC ACID, ED    Imaging Review Dg Chest Port 1 View  10/13/2014   CLINICAL DATA:  Shortness of Breath  EXAM: PORTABLE CHEST - 1 VIEW  COMPARISON:  06/12/2014  FINDINGS: Cardiomegaly. There is streaky right infrahilar atelectasis or infiltrate. No pulmonary edema. Extensive degenerative changes left shoulder.  IMPRESSION: No pulmonary edema. Streaky right infrahilar atelectasis or infiltrate.   Electronically Signed   By: Lahoma Crocker M.D.   On:  10/13/2014 22:45    EKG Interpretation   Date/Time:  Tuesday October 13 2014 22:26:46 EDT Ventricular Rate:  113 PR Interval:  133 QRS Duration: 144 QT Interval:  353 QTC Calculation: 484 R Axis:   -32 Text Interpretation:  Sinus tachycardia Atrial premature complex Left  bundle branch block `LBBB is old, however  increased/new st elev v2-v4  Confirmed by Ashok Cordia  MD, Lennette Bihari ([id number]) on 10/13/2014 10:31:46 PM      MDM   Final diagnoses:  COPD exacerbation  Lactic acidosis   Pt is a 81 yo F with hx of CHF, COPD (on advair and albuterol at home), DM, HLD, HTN, tobacco abuse who presented with SOB.  Called EMS and was found her tripoding, with O2 stas in 70% and decreased breath sounds.  She was given albuterol and placed on BiPAP by EMS.   Presented with significantly diminished breath sounds but O2 sats in upper 90s on room air.  She was started on 10 mg/hr continuous albuterol, given 125 mg solumedrol, and 500 cc NS bolus.  Improved energy level, air movement, and decreased work of breathing within the first 30 minutes of treatment.  ABG shows hypercarbia in the 60s so she was kept on the BiPAP.    Labs returned remarkable for elevated BNP to 3407.  No leukocytosis, normal Hb, electrolytes ok, good kidney function.  CXR with no obvious opacities, no  pulmonary edema.    Repeat lactate shows a rise from 0.89 to 3.46.   Admitted to hospitalists team, Dr. Shanon Brow, [facility] appropriate.     If performed, labs, EKGs, and imaging were reviewed and interpreted by myself and my attending, and incorporated in the medical decision making.  Patient was seen with ED Attending, Dr. Gilford Rile, MD     Tori Milks, MD 10/16/14 5638  Lajean Saver, MD 10/19/14 [phone]

## 2014-10-14 ENCOUNTER — Inpatient Hospital Stay (HOSPITAL_COMMUNITY): Payer: Medicare Other

## 2014-10-14 ENCOUNTER — Encounter (HOSPITAL_COMMUNITY): Payer: Self-pay | Admitting: *Deleted

## 2014-10-14 DIAGNOSIS — J9692 Respiratory failure, unspecified with hypercapnia: Secondary | ICD-10-CM

## 2014-10-14 DIAGNOSIS — J9621 Acute and chronic respiratory failure with hypoxia: Secondary | ICD-10-CM | POA: Diagnosis not present

## 2014-10-14 DIAGNOSIS — Z880 Allergy status to penicillin: Secondary | ICD-10-CM | POA: Diagnosis not present

## 2014-10-14 DIAGNOSIS — I10 Essential (primary) hypertension: Secondary | ICD-10-CM | POA: Diagnosis present

## 2014-10-14 DIAGNOSIS — I5033 Acute on chronic diastolic (congestive) heart failure: Secondary | ICD-10-CM | POA: Diagnosis present

## 2014-10-14 DIAGNOSIS — D509 Iron deficiency anemia, unspecified: Secondary | ICD-10-CM | POA: Diagnosis present

## 2014-10-14 DIAGNOSIS — Z833 Family history of diabetes mellitus: Secondary | ICD-10-CM | POA: Diagnosis not present

## 2014-10-14 DIAGNOSIS — Z79899 Other long term (current) drug therapy: Secondary | ICD-10-CM | POA: Diagnosis not present

## 2014-10-14 DIAGNOSIS — R0602 Shortness of breath: Secondary | ICD-10-CM | POA: Diagnosis not present

## 2014-10-14 DIAGNOSIS — J441 Chronic obstructive pulmonary disease with (acute) exacerbation: Secondary | ICD-10-CM | POA: Diagnosis present

## 2014-10-14 DIAGNOSIS — J90 Pleural effusion, not elsewhere classified: Secondary | ICD-10-CM | POA: Diagnosis not present

## 2014-10-14 DIAGNOSIS — E780 Pure hypercholesterolemia: Secondary | ICD-10-CM | POA: Diagnosis present

## 2014-10-14 DIAGNOSIS — E872 Acidosis, unspecified: Secondary | ICD-10-CM | POA: Insufficient documentation

## 2014-10-14 DIAGNOSIS — E11319 Type 2 diabetes mellitus with unspecified diabetic retinopathy without macular edema: Secondary | ICD-10-CM | POA: Diagnosis present

## 2014-10-14 DIAGNOSIS — J9690 Respiratory failure, unspecified, unspecified whether with hypoxia or hypercapnia: Secondary | ICD-10-CM | POA: Diagnosis not present

## 2014-10-14 DIAGNOSIS — J9691 Respiratory failure, unspecified with hypoxia: Secondary | ICD-10-CM

## 2014-10-14 DIAGNOSIS — M810 Age-related osteoporosis without current pathological fracture: Secondary | ICD-10-CM | POA: Diagnosis present

## 2014-10-14 DIAGNOSIS — Z515 Encounter for palliative care: Secondary | ICD-10-CM | POA: Diagnosis not present

## 2014-10-14 DIAGNOSIS — I517 Cardiomegaly: Secondary | ICD-10-CM | POA: Diagnosis not present

## 2014-10-14 DIAGNOSIS — I5043 Acute on chronic combined systolic (congestive) and diastolic (congestive) heart failure: Secondary | ICD-10-CM | POA: Diagnosis not present

## 2014-10-14 DIAGNOSIS — R0600 Dyspnea, unspecified: Secondary | ICD-10-CM | POA: Diagnosis not present

## 2014-10-14 DIAGNOSIS — J9601 Acute respiratory failure with hypoxia: Secondary | ICD-10-CM | POA: Diagnosis present

## 2014-10-14 DIAGNOSIS — Z7982 Long term (current) use of aspirin: Secondary | ICD-10-CM | POA: Diagnosis not present

## 2014-10-14 DIAGNOSIS — I5032 Chronic diastolic (congestive) heart failure: Secondary | ICD-10-CM | POA: Diagnosis not present

## 2014-10-14 DIAGNOSIS — J690 Pneumonitis due to inhalation of food and vomit: Secondary | ICD-10-CM | POA: Diagnosis not present

## 2014-10-14 DIAGNOSIS — J45901 Unspecified asthma with (acute) exacerbation: Secondary | ICD-10-CM

## 2014-10-14 DIAGNOSIS — F17210 Nicotine dependence, cigarettes, uncomplicated: Secondary | ICD-10-CM | POA: Diagnosis present

## 2014-10-14 DIAGNOSIS — J449 Chronic obstructive pulmonary disease, unspecified: Secondary | ICD-10-CM | POA: Diagnosis not present

## 2014-10-14 DIAGNOSIS — J9811 Atelectasis: Secondary | ICD-10-CM | POA: Diagnosis not present

## 2014-10-14 DIAGNOSIS — J9602 Acute respiratory failure with hypercapnia: Secondary | ICD-10-CM

## 2014-10-14 DIAGNOSIS — J9622 Acute and chronic respiratory failure with hypercapnia: Secondary | ICD-10-CM | POA: Diagnosis not present

## 2014-10-14 LAB — URINE MICROSCOPIC-ADD ON

## 2014-10-14 LAB — GLUCOSE, CAPILLARY
GLUCOSE-CAPILLARY: 138 mg/dL — AB (ref 65–99)
GLUCOSE-CAPILLARY: 88 mg/dL (ref 65–99)
Glucose-Capillary: 232 mg/dL — ABNORMAL HIGH (ref 65–99)
Glucose-Capillary: 154 mg/dL — ABNORMAL HIGH (ref 65–99)
Glucose-Capillary: 135 mg/dL — ABNORMAL HIGH (ref 65–99)

## 2014-10-14 LAB — MAGNESIUM
MAGNESIUM: 1.2 mg/dL — AB (ref 1.7–2.4)
Magnesium: 1.8 mg/dL (ref 1.7–2.4)

## 2014-10-14 LAB — LACTIC ACID, PLASMA
Lactic Acid, Venous: 4.1 mmol/L (ref 0.5–2.0)
Lactic Acid, Venous: 1.8 mmol/L (ref 0.5–2.0)
Lactic Acid, Venous: 1.2 mmol/L (ref 0.5–2.0)
Lactic Acid, Venous: 1.2 mmol/L (ref 0.5–2.0)

## 2014-10-14 LAB — I-STAT CG4 LACTIC ACID, ED: Lactic Acid, Venous: 3.46 mmol/L (ref 0.5–2.0)

## 2014-10-14 LAB — BASIC METABOLIC PANEL
Anion gap: 10 (ref 5–15)
Anion gap: 10 (ref 5–15)
BUN: 13 mg/dL (ref 6–20)
BUN: 15 mg/dL (ref 6–20)
CALCIUM: 8.4 mg/dL — AB (ref 8.9–10.3)
CHLORIDE: 105 mmol/L (ref 101–111)
CO2: 28 mmol/L (ref 22–32)
CO2: 30 mmol/L (ref 22–32)
CREATININE: 0.71 mg/dL (ref 0.44–1.00)
CREATININE: 0.62 mg/dL (ref 0.44–1.00)
Calcium: 7.9 mg/dL — ABNORMAL LOW (ref 8.9–10.3)
Chloride: 104 mmol/L (ref 101–111)
GFR calc Af Amer: >60 mL/min (ref >60)
GFR calc non Af Amer: >60 mL/min (ref >60)
GFR calc non Af Amer: >60 mL/min (ref >60)
Glucose, Bld: 238 mg/dL — ABNORMAL HIGH (ref 65–99)
Glucose, Bld: 99 mg/dL (ref 65–99)
POTASSIUM: 2.9 mmol/L — AB (ref 3.5–5.1)
Potassium: 4.2 mmol/L (ref 3.5–5.1)
Sodium: 143 mmol/L (ref 135–145)
Sodium: 144 mmol/L (ref 135–145)

## 2014-10-14 LAB — URINALYSIS, ROUTINE W REFLEX MICROSCOPIC
Glucose, UA: NEGATIVE mg/dL
Hgb urine dipstick: NEGATIVE
KETONES UR: 15 mg/dL — AB
Leukocytes, UA: NEGATIVE
NITRITE: NEGATIVE
Protein, ur: 100 mg/dL — AB
Specific Gravity, Urine: 1.024 (ref 1.005–1.030)
UROBILINOGEN UA: 1 mg/dL (ref 0.0–1.0)
pH: 5 (ref 5.0–8.0)

## 2014-10-14 LAB — CBC
HEMATOCRIT: 32.4 % — AB (ref 36.0–46.0)
Hemoglobin: 10 g/dL — ABNORMAL LOW (ref 12.0–15.0)
MCH: 28.4 pg (ref 26.0–34.0)
MCHC: 30.9 g/dL (ref 30.0–36.0)
MCV: 92 fL (ref 78.0–100.0)
PLATELETS: 164 10*3/uL (ref 150–400)
RBC: 3.52 MIL/uL — AB (ref 3.87–5.11)
RDW: 19.9 % — ABNORMAL HIGH (ref 11.5–15.5)
WBC: 6.3 10*3/uL (ref 4.0–10.5)

## 2014-10-14 LAB — MRSA PCR SCREENING: MRSA by PCR: NEGATIVE

## 2014-10-14 LAB — PROCALCITONIN: Procalcitonin: 0.12 ng/mL

## 2014-10-14 LAB — I-STAT TROPONIN, ED: TROPONIN I, POC: 0 ng/mL (ref 0.00–0.08)

## 2014-10-14 MED ORDER — FUROSEMIDE 10 MG/ML IJ SOLN
40.0000 mg | Freq: Once | INTRAMUSCULAR | Status: AC
  Administered 2014-10-14: 40 mg via INTRAVENOUS
  Filled 2014-10-14: qty 4

## 2014-10-14 MED ORDER — IPRATROPIUM BROMIDE 0.02 % IN SOLN
0.5000 mg | Freq: Four times a day (QID) | RESPIRATORY_TRACT | Status: DC
Start: 2014-10-14 — End: 2014-10-14

## 2014-10-14 MED ORDER — ARFORMOTEROL TARTRATE 15 MCG/2ML IN NEBU
15.0000 ug | INHALATION_SOLUTION | Freq: Two times a day (BID) | RESPIRATORY_TRACT | Status: DC
  Administered 2014-10-14 – 2014-10-19 (×8): 15 ug via RESPIRATORY_TRACT
  Filled 2014-10-14 (×13): qty 2

## 2014-10-14 MED ORDER — ALBUTEROL SULFATE (2.5 MG/3ML) 0.083% IN NEBU: 2.5000 mg | INHALATION_SOLUTION | Freq: Four times a day (QID) | RESPIRATORY_TRACT | Status: DC

## 2014-10-14 MED ORDER — DILTIAZEM HCL 30 MG PO TABS
30.0000 mg | ORAL_TABLET | Freq: Two times a day (BID) | ORAL | Status: DC
  Administered 2014-10-14 – 2014-10-19 (×10): 30 mg via ORAL
  Filled 2014-10-14 (×12): qty 1

## 2014-10-14 MED ORDER — METHYLPREDNISOLONE SODIUM SUCC 40 MG IJ SOLR
40.0000 mg | Freq: Two times a day (BID) | INTRAMUSCULAR | Status: DC
  Administered 2014-10-14 – 2014-10-15 (×3): 40 mg via INTRAVENOUS
  Filled 2014-10-14 (×6): qty 1

## 2014-10-14 MED ORDER — AZITHROMYCIN 500 MG PO TABS
500.0000 mg | ORAL_TABLET | Freq: Every day | ORAL | Status: AC
  Administered 2014-10-14: 500 mg via ORAL
  Filled 2014-10-14: qty 1

## 2014-10-14 MED ORDER — IPRATROPIUM-ALBUTEROL 0.5-2.5 (3) MG/3ML IN SOLN
3.0000 mL | Freq: Four times a day (QID) | RESPIRATORY_TRACT | Status: DC
  Administered 2014-10-14 (×2): 3 mL via RESPIRATORY_TRACT
  Filled 2014-10-14 (×2): qty 3

## 2014-10-14 MED ORDER — MORPHINE SULFATE 2 MG/ML IJ SOLN
2.0000 mg | INTRAMUSCULAR | Status: DC | PRN
  Administered 2014-10-14 – 2014-10-15 (×2): 2 mg via INTRAVENOUS
  Filled 2014-10-14 (×2): qty 1

## 2014-10-14 MED ORDER — ONDANSETRON HCL 4 MG PO TABS: 4.0000 mg | ORAL_TABLET | Freq: Four times a day (QID) | ORAL | Status: DC | PRN

## 2014-10-14 MED ORDER — ENOXAPARIN SODIUM 30 MG/0.3ML ~~LOC~~ SOLN
30.0000 mg | SUBCUTANEOUS | Status: DC
  Administered 2014-10-14 – 2014-10-19 (×6): 30 mg via SUBCUTANEOUS
  Filled 2014-10-14 (×6): qty 0.3

## 2014-10-14 MED ORDER — BIOTENE DRY MOUTH MT LIQD
15.0000 mL | OROMUCOSAL | Status: DC
  Administered 2014-10-14 – 2014-10-19 (×26): 15 mL via OROMUCOSAL

## 2014-10-14 MED ORDER — DEXTROSE 5 % IV SOLN
500.0000 mg | INTRAVENOUS | Status: DC
Start: 2014-10-14 — End: 2014-10-14
  Administered 2014-10-14: 500 mg via INTRAVENOUS
  Filled 2014-10-14: qty 500

## 2014-10-14 MED ORDER — INSULIN ASPART 100 UNIT/ML ~~LOC~~ SOLN
0.0000 [IU] | SUBCUTANEOUS | Status: DC
  Administered 2014-10-14: 1 [IU] via SUBCUTANEOUS
  Administered 2014-10-14: 2 [IU] via SUBCUTANEOUS
  Administered 2014-10-14: 1 [IU] via SUBCUTANEOUS
  Administered 2014-10-14: 3 [IU] via SUBCUTANEOUS
  Administered 2014-10-15 (×3): 1 [IU] via SUBCUTANEOUS
  Administered 2014-10-15: 3 [IU] via SUBCUTANEOUS
  Administered 2014-10-16 (×2): 5 [IU] via SUBCUTANEOUS
  Administered 2014-10-16 (×2): 2 [IU] via SUBCUTANEOUS
  Administered 2014-10-17 (×2): 7 [IU] via SUBCUTANEOUS
  Administered 2014-10-17: 2 [IU] via SUBCUTANEOUS

## 2014-10-14 MED ORDER — AZITHROMYCIN 250 MG PO TABS
250.0000 mg | ORAL_TABLET | Freq: Every day | ORAL | Status: DC
  Administered 2014-10-15: 250 mg via ORAL
  Filled 2014-10-14: qty 1

## 2014-10-14 MED ORDER — ALBUTEROL SULFATE (2.5 MG/3ML) 0.083% IN NEBU
2.5000 mg | INHALATION_SOLUTION | RESPIRATORY_TRACT | Status: DC | PRN
Start: 2014-10-14 — End: 2014-10-19
  Administered 2014-10-14 – 2014-10-15 (×3): 2.5 mg via RESPIRATORY_TRACT
  Filled 2014-10-14 (×3): qty 3

## 2014-10-14 MED ORDER — SODIUM CHLORIDE 0.9 % IV SOLN
INTRAVENOUS | Status: DC
  Administered 2014-10-14: 04:00:00 via INTRAVENOUS

## 2014-10-14 MED ORDER — METHYLPREDNISOLONE SODIUM SUCC 125 MG IJ SOLR
80.0000 mg | Freq: Two times a day (BID) | INTRAMUSCULAR | Status: DC
  Administered 2014-10-14: 80 mg via INTRAVENOUS
  Filled 2014-10-14: qty 2
  Filled 2014-10-14 (×2): qty 1.28

## 2014-10-14 MED ORDER — IPRATROPIUM-ALBUTEROL 0.5-2.5 (3) MG/3ML IN SOLN
RESPIRATORY_TRACT | Status: AC
  Administered 2014-10-14: 3 mL
  Filled 2014-10-14: qty 3

## 2014-10-14 MED ORDER — POTASSIUM CHLORIDE CRYS ER 20 MEQ PO TBCR
40.0000 meq | EXTENDED_RELEASE_TABLET | Freq: Two times a day (BID) | ORAL | Status: DC
  Administered 2014-10-14 (×2): 40 meq via ORAL
  Filled 2014-10-14 (×4): qty 2

## 2014-10-14 MED ORDER — POTASSIUM CHLORIDE 10 MEQ/100ML IV SOLN
10.0000 meq | INTRAVENOUS | Status: DC
  Administered 2014-10-14: 10 meq via INTRAVENOUS
  Filled 2014-10-14: qty 100

## 2014-10-14 MED ORDER — BUDESONIDE 0.25 MG/2ML IN SUSP
0.5000 mg | Freq: Two times a day (BID) | RESPIRATORY_TRACT | Status: DC
  Administered 2014-10-14: 0.25 mg via RESPIRATORY_TRACT
  Administered 2014-10-15 (×2): 0.5 mg via RESPIRATORY_TRACT
  Filled 2014-10-14 (×6): qty 4

## 2014-10-14 MED ORDER — MORPHINE SULFATE 2 MG/ML IJ SOLN
INTRAMUSCULAR | Status: AC
  Filled 2014-10-14: qty 1

## 2014-10-14 MED ORDER — ONDANSETRON HCL 4 MG/2ML IJ SOLN: 4.0000 mg | Freq: Four times a day (QID) | INTRAMUSCULAR | Status: DC | PRN

## 2014-10-14 MED ORDER — POTASSIUM CHLORIDE 10 MEQ/100ML IV SOLN
10.0000 meq | INTRAVENOUS | Status: AC
  Administered 2014-10-14 (×2): 10 meq via INTRAVENOUS
  Filled 2014-10-14: qty 100

## 2014-10-14 MED ORDER — FUROSEMIDE 10 MG/ML IJ SOLN
20.0000 mg | Freq: Two times a day (BID) | INTRAMUSCULAR | Status: DC
  Administered 2014-10-14: 20 mg via INTRAVENOUS
  Filled 2014-10-14: qty 2

## 2014-10-14 NOTE — Consult Note (Signed)
Name: Susan Browning MRN: [id number] DOB: 02/10/1921    ADMISSION DATE:  10/13/2014 CONSULTATION DATE:  7/6  REFERRING MD :  Verlon Au   CHIEF COMPLAINT:   Acute on chronic hypercarbic resp failure   BRIEF PATIENT DESCRIPTION:  81 yo female h/o copd, Admitted 7/5 w/ working dx of decompensated diastolic HF +/- AECOPD. PCCM asked to see on 7/6 when nursing raised concern for increased work of breathing and on IM service assessment found to have increased RR and accessory muscle use.    SIGNIFICANT EVENTS    STUDIES:     HISTORY OF PRESENT ILLNESS:   81 yo female h/o copd, dm, htn, combined chf, admitted 7/5 w/ sob. No recent fevers. No cough. No swelling, or edema. Weight has been stable. Her family called ems, who arrived gave her albuterol and placed her on bipap. In the ED her breathing has much improved, and she is able to speak now. She denied any pain.CXR w/ element of edema. Admitted w/ working dx of decompensated diastolic HF +/- AECOPD. PCCM asked to see on 7/6 when nursing raised concern for increased work of breathing.   PAST MEDICAL HISTORY :   has a past medical history of DIABETES MELLITUS, TYPE II (01/03/2007); HYPERCHOLESTEROLEMIA (06/01/2009); ANEMIA, IRON DEFICIENCY (08/15/2007); LEUKOPENIA, CHRONIC (06/01/2009); HYPERTENSION (01/03/2007); ALLERGIC RHINITIS (01/03/2007); OSTEOARTHRITIS (01/03/2007); SHOULDER PAIN, LEFT (02/18/2008); OSTEOPOROSIS (01/03/2007); NONSPECIFIC ABNORMAL ELECTROCARDIOGRAM (02/12/2007); COLONIC POLYPS, HX OF (01/03/2007); Diabetic retinopathy; Smoker; and OA (osteoarthritis).  has past surgical history that includes Child birth and Dilation and curettage of uterus. Prior to Admission medications   Medication Sig Start Date End Date Taking? Authorizing Provider  aspirin EC 81 MG tablet Take 81 mg by mouth daily.   Yes Historical Provider, MD  Fluticasone-Salmeterol (ADVAIR DISKUS) 100-50 MCG/DOSE AEPB Inhale 1 puff into the lungs 2 (two) times  daily. 09/09/14  Yes Renato Shin, MD  furosemide (LASIX) 20 MG tablet Take 1 tablet (20 mg total) by mouth daily. 06/12/14  Yes Renato Shin, MD  IRON PO Take 1 tablet by mouth 2 (two) times daily.   Yes Historical Provider, MD  metFORMIN (GLUCOPHAGE) 1000 MG tablet TAKE 1 TABLET (1,000 MG TOTAL) BY MOUTH 2 (TWO) TIMES DAILY WITH A MEAL. 05/18/14  Yes Renato Shin, MD  repaglinide (PRANDIN) 0.5 MG tablet Take 0.5 mg by mouth 3 (three) times daily. 10/01/14  Yes Historical Provider, MD  ALPRAZolam Duanne Moron) 0.25 MG tablet 1/2-1 tab every 8 hrs as needed for anxiety or sleep Patient not taking: Reported on 10/14/2014 09/02/14   Renato Shin, MD  doxycycline (VIBRAMYCIN) 50 MG capsule Take 1 capsule (50 mg total) by mouth 2 (two) times daily. Patient not taking: Reported on 10/14/2014 09/09/14   Renato Shin, MD  feeding supplement, ENSURE COMPLETE, (ENSURE COMPLETE) LIQD Take 237 mLs by mouth 2 (two) times daily between meals. Patient not taking: Reported on 10/14/2014 06/15/14   Eugenie Filler, MD   Allergies  Allergen Reactions  . Penicillins     FAMILY HISTORY:  family history includes Breast cancer in her maternal aunt; Diabetes in her sister; Liver disease in her daughter. There is no history of Colon cancer. SOCIAL HISTORY:  reports that she has been smoking Cigarettes.  She has been smoking about 0.50 packs per day. She does not have any smokeless tobacco history on file. She reports that she does not drink alcohol or use illicit drugs.  REVIEW OF SYSTEMS:   Constitutional: Negative for fever, chills, weight loss, malaise/fatigue  and diaphoresis.  HENT: Negative for hearing loss, ear pain, nosebleeds, congestion, sore throat, neck pain, tinnitus and ear discharge.   Eyes: Negative for blurred vision, double vision, photophobia, pain, discharge and redness.  Respiratory: Negative for cough, hemoptysis, -sputum production, shortness of breath, -wheezing and stridor.   Cardiovascular: Negative for chest  pain, palpitations, orthopnea, claudication, leg swelling and PND.  Gastrointestinal: Negative for heartburn, nausea, vomiting, abdominal pain, diarrhea, constipation, blood in stool and melena.  Genitourinary: Negative for dysuria, urgency, frequency, hematuria and flank pain.  Musculoskeletal: Negative for myalgias, back pain, joint pain and falls.  Skin: Negative for itching and rash.  Neurological: Negative for dizziness, tingling, tremors, sensory change, speech change, focal weakness, seizures, loss of consciousness, weakness and headaches.  Endo/Heme/Allergies: Negative for environmental allergies and polydipsia. Does not bruise/bleed easily.  SUBJECTIVE:  + accessory muscle use  VITAL SIGNS: Temp:  [97.4 F (36.3 C)-98 F (36.7 C)] 98 F (36.7 C) (07/06 1205) Pulse Rate:  [54-112] 108 (07/06 1500) Resp:  [18-38] 26 (07/06 1500) BP: (113-164)/(49-102) 160/90 mmHg (07/06 1500) SpO2:  [94 %-100 %] 100 % (07/06 1500) FiO2 (%):  [40 %-100 %] 40 % (07/06 0700) Weight:  [51.4 kg (113 lb 5.1 oz)] 51.4 kg (113 lb 5.1 oz) (07/06 0341)  PHYSICAL EXAMINATION: General:  Frail 81 year old female, awake not in distress. + accessory muscle use  Neuro:  Oriented X 3 HEENT:  [place] no JVD  Cardiovascular:  rrr Lungs:  Rales posterior lower  Abdomen:  Soft, + bowel sounds  Musculoskeletal:  Intact  Skin:  Intact    Recent Labs Lab 10/13/14 2226 10/14/14 0332  NA 141 143  K 3.5 2.9*  CL 104 105  CO2 28 28  BUN 14 13  CREATININE 0.71 0.71  GLUCOSE 105* 238*    Recent Labs Lab 10/13/14 2226 10/14/14 0332  HGB 11.5* 10.0*  HCT 36.5 32.4*  WBC 6.1 6.3  PLT 177 164    Recent Labs Lab 10/13/14 2226 10/13/14 2232 10/14/14 0101 10/14/14 0332 10/14/14 0957 10/14/14 0958 10/14/14 1415  PROCALCITON  --   --   --   --  0.12  --   --   WBC 6.1  --   --  6.3  --   --   --   LATICACIDVEN  --  0.89 3.46*  --   --  4.1* 1.8    ABG    Component Value Date/Time   PHART 7.313*  10/13/2014 2236   PCO2ART 60.2* 10/13/2014 2236   PO2ART 134.0* 10/13/2014 2236   HCO3 30.5* 10/13/2014 2236   TCO2 32 10/13/2014 2236   O2SAT 99.0 10/13/2014 2236    Dg Chest 2 View  10/14/2014   CLINICAL DATA:  Episode of shortness of breath last evening, currently clinically improved  EXAM: CHEST  2 VIEW  COMPARISON:  Portable chest x-ray of October 13, 2014  FINDINGS: The cardiopericardial silhouette remains enlarged. The central pulmonary vascularity is mildly engorged and slightly more conspicuous today. The pulmonary interstitial markings are increased bilaterally as compared to the previous study. There remain areas of patchy increased density in the right infrahilar region. There is minimal blunting of the posterior costophrenic angles bilaterally. There are degenerative changes of the left glenohumeral joint. The thoracic spine is not well evaluated on the lateral view. No high-grade compression is observed.  IMPRESSION: COPD with low-grade CHF. When compared to the previous study the pulmonary vascularity and interstitial markings are slightly more conspicuous.  Electronically Signed   By: Timothea Bodenheimer  [place] M.D.   On: 10/14/2014 08:20   Dg Chest Port 1 View  10/13/2014   CLINICAL DATA:  Shortness of Breath  EXAM: PORTABLE CHEST - 1 VIEW  COMPARISON:  06/12/2014  FINDINGS: Cardiomegaly. There is streaky right infrahilar atelectasis or infiltrate. No pulmonary edema. Extensive degenerative changes left shoulder.  IMPRESSION: No pulmonary edema. Streaky right infrahilar atelectasis or infiltrate.   Electronically Signed   By: Lahoma Crocker M.D.   On: 10/13/2014 22:45    ASSESSMENT / PLAN:  Acute on chronic hypercarbic respiratory failure in setting of decompensated diastolic dysfxn w/ pulmonary edema; +/- AECOPD.  Baseline CO2 calculates to 54, currently 60.  Plan Change BIPAP to PRN Scheduled brovana and budesonide Cont systemic steroids but decrease dose Cont azithro  Cont diuresis (gave extra  dose lasix) Don't think she needs ICU   Erick Colace ACNP-BC [place] Pager # [phone] OR # [phone] if no answer   10/14/2014, 3:48 PM  PCCM ATTENDING: I have reviewed pt's initial presentation, consultants notes and hospital database in detail.  The above assessment and plan was formulated under my direction.  In summary: Advanced age H/O COPD Still smokes infrequently Was in resp distress earlier in day prompting BiPAP Now looks good off BiPAP though tachypneic She denies dyspnea and has very strong cough Chest exam reveals diminished BS throughout with bilateral crackles and no wheezes Her JVP appears elevated but there is no LE edema  Have ordered another dose of furosemide Systemic steroids dose adjusted Added nebulized steroids Adjusted bronchodilator regimen  She is a poor candidate for intubation and mechanical ventilation based on advanced age and apparent frailty She will need further discussion re: advanced directives emphasizing what would likely be a poor prognosis for functional recovery if she were to undergo intubation. It might be beneficial to have Palliative Care involvement   Merton Border, MD;  PCCM service; [place] [phone]

## 2014-10-14 NOTE — Care Management Note (Addendum)
Case Management Note  Patient Details  Name: Susan Browning MRN: [id number] Date of Birth: 1921-01-01  Subjective/Objective:    Pt admitted with acute resp. Failure, acute on chronic CHF, acute COPD exacerbation         Action/Plan:  PTA pt lived at home with family, NCM to follow for d/c needs.   Expected Discharge Date:                  Expected Discharge Plan:  [place]  In-House Referral:     Discharge planning Services  CM Consult  Post Acute Care Choice:    Choice offered to:     DME Arranged:    DME Agency:     HH Arranged:    [place] Agency:     Status of Service:  In process, will continue to follow  Medicare Important Message Given:    Date Medicare IM Given:    Medicare IM give by:    Date Additional Medicare IM Given:    Additional Medicare Important Message give by:     If discussed at [place] of Stay Meetings, dates discussed:    Additional Comments:  Dawayne Patricia, RN 10/14/2014, 10:14 AM

## 2014-10-14 NOTE — Progress Notes (Signed)
Pt is off BiPAP at this time per MD. Pt was placed on 3L [place] O2 saturations are 98%. Pt is in mild respiratory distress. RN aware, MD aware.

## 2014-10-14 NOTE — Progress Notes (Signed)
Prcalcitonin which is low agues against infectious etiology Diurese RX COPD + smoker status Monitor Tx out of SDU soon if remains stable  Verneita Griffes, MD [facility] [phone]

## 2014-10-14 NOTE — Progress Notes (Signed)
Utilization review completed.  

## 2014-10-14 NOTE — Significant Event (Addendum)
Acute resp decompensation ~15:00 Nursing reports ? WOB, Tachycardic tachypnoeic  Patient alert but all acc muscles in play JVF still ?  I/o-650 cta -coarse lung sounds.  No fremitus no resonance s1 s 2reg   A/p Will start back Bipap-Will keep opn overnight Get Abg ~ 60 min D/w daughter HCPOA linda-confirms full code status Cautioned that given age, comorbidity not sure if she would survive aggressive resuscitation per ACLS Dr. Stevenson Clinch of CCM consulted and will arrange for patient to be seen in consult.  In terms of CODE status-I will change her to ICU status-next 24 hours will reveal how she does and might need to explore either pure comfort approach if non- resolution or intubation dependant on family.   Complicated situation as she is relatively high-functioning at home with recent loss of husband ~ 2 mo ago   Verneita Griffes, MD [facility] [phone]

## 2014-10-14 NOTE — Progress Notes (Signed)
Went to assess my pt. My pt is off the BiPAP at this time. Pt is on a 3L [place] O2 saturations are 99%. No signs of respiratory distress. No accessory muscle use noted. Pt appears comfortable and she states that she is doing fine this morning. RT will continue to monitor

## 2014-10-14 NOTE — Progress Notes (Signed)
PT called out for help. RN to beside.RN observed pt's increased WOB, shallow irregular respiration, with accessory muscle use. Pt verbalized "I cannot breath." Pt repositioned in bed with HOB >45degrees. Diminished lung sounds auscultated throughout all fields. O2 sats 88-89% 3 L [place], RR 30-40,  HR 115-125  BP 160/90. Scheduled duoneb treatment given. Dr. Verlon Au called. Pt placed on Bipap.

## 2014-10-14 NOTE — H&P (Signed)
PCP:   Renato Shin, MD   Chief Complaint:  sob  HPI: 81 yo female h/o copd, dm, htn, combined chf, comes in with sob that occurred this afternoon at home.  No recent fevers.  No cough.  No swelling, or edema.  Weight has been stable.  Her family called ems, who arrived gave her albuterol and placed her on bipap.  In the ED her breathing has much improved, and she is able to speak now.  She denies any pain.  Says her breathing is much better after iv solumedrol and 2 back to back hour long albuterol nebs.  She still smokes some cigerettes.    Review of Systems:  Positive and negative as per HPI otherwise all other systems are negative  Past Medical History: Past Medical History  Diagnosis Date  . DIABETES MELLITUS, TYPE II 01/03/2007  . HYPERCHOLESTEROLEMIA 06/01/2009  . ANEMIA, IRON DEFICIENCY 08/15/2007  . LEUKOPENIA, CHRONIC 06/01/2009  . HYPERTENSION 01/03/2007  . ALLERGIC RHINITIS 01/03/2007  . OSTEOARTHRITIS 01/03/2007  . SHOULDER PAIN, LEFT 02/18/2008  . OSTEOPOROSIS 01/03/2007  . NONSPECIFIC ABNORMAL ELECTROCARDIOGRAM 02/12/2007  . COLONIC POLYPS, HX OF 01/03/2007  . Diabetic retinopathy   . Smoker   . OA (osteoarthritis)    Past Surgical History  Procedure Laterality Date  . Child birth      x's 5  . Dilation and curettage of uterus      Medications: Prior to Admission medications   Medication Sig Start Date End Date Taking? Authorizing Provider  aspirin EC 81 MG tablet Take 81 mg by mouth daily.   Yes Historical Provider, MD  Fluticasone-Salmeterol (ADVAIR DISKUS) 100-50 MCG/DOSE AEPB Inhale 1 puff into the lungs 2 (two) times daily. 09/09/14  Yes Renato Shin, MD  furosemide (LASIX) 20 MG tablet Take 1 tablet (20 mg total) by mouth daily. 06/12/14  Yes Renato Shin, MD  IRON PO Take 1 tablet by mouth 2 (two) times daily.   Yes Historical Provider, MD  metFORMIN (GLUCOPHAGE) 1000 MG tablet TAKE 1 TABLET (1,000 MG TOTAL) BY MOUTH 2 (TWO) TIMES DAILY WITH A MEAL. 05/18/14  Yes  Renato Shin, MD  repaglinide (PRANDIN) 0.5 MG tablet Take 0.5 mg by mouth 3 (three) times daily. 10/01/14  Yes Historical Provider, MD  ALPRAZolam Duanne Moron) 0.25 MG tablet 1/2-1 tab every 8 hrs as needed for anxiety or sleep Patient not taking: Reported on 10/14/2014 09/02/14   Renato Shin, MD  doxycycline (VIBRAMYCIN) 50 MG capsule Take 1 capsule (50 mg total) by mouth 2 (two) times daily. Patient not taking: Reported on 10/14/2014 09/09/14   Renato Shin, MD  feeding supplement, ENSURE COMPLETE, (ENSURE COMPLETE) LIQD Take 237 mLs by mouth 2 (two) times daily between meals. Patient not taking: Reported on 10/14/2014 06/15/14   Eugenie Filler, MD    Allergies:   Allergies  Allergen Reactions  . Penicillins     Social History:  reports that she has been smoking Cigarettes.  She has been smoking about 0.50 packs per day. She does not have any smokeless tobacco history on file. She reports that she does not drink alcohol or use illicit drugs.  Family History: Family History  Problem Relation Age of Onset  . Diabetes Sister   . Liver disease Daughter     Cirrhosis-transplant  . Colon cancer Neg Hx   . Breast cancer Maternal Aunt     Physical Exam: Filed Vitals:   10/13/14 2232 10/13/14 2300 10/13/14 2330 10/14/14 0000  BP:  136/66  143/52 133/69  Pulse:  86 108 111  Resp:  33 26 33  SpO2: 100% 100% 100% 100%   General appearance: alert, cooperative and mild distress Head: Normocephalic, without obvious abnormality, atraumatic Eyes: negative Nose: Nares normal. Septum midline. Mucosa normal. No drainage or sinus tenderness. Neck: no JVD and supple, symmetrical, trachea midline Lungs: diminished breath sounds bilaterally Heart: regular rate and rhythm, S1, S2 normal, no murmur, click, rub or gallop Abdomen: soft, non-tender; bowel sounds normal; no masses,  no organomegaly Extremities: extremities normal, atraumatic, no cyanosis or edema Pulses: 2+ and symmetric Skin: Skin color,  texture, turgor normal. No rashes or lesions Neurologic: Grossly normal  Labs on Admission:   Recent Labs  10/13/14 2226  NA 141  K 3.5  CL 104  CO2 28  GLUCOSE 105*  BUN 14  CREATININE 0.71  CALCIUM 8.0*    Recent Labs  10/13/14 2226  AST 78*  ALT 38  ALKPHOS 100  BILITOT 0.7  PROT 6.7  ALBUMIN 3.5    Recent Labs  10/13/14 2226  WBC 6.1  HGB 11.5*  HCT 36.5  MCV 92.9  PLT 177   Radiological Exams on Admission: Dg Chest Port 1 View  10/13/2014   CLINICAL DATA:  Shortness of Breath  EXAM: PORTABLE CHEST - 1 VIEW  COMPARISON:  06/12/2014  FINDINGS: Cardiomegaly. There is streaky right infrahilar atelectasis or infiltrate. No pulmonary edema. Extensive degenerative changes left shoulder.  IMPRESSION: No pulmonary edema. Streaky right infrahilar atelectasis or infiltrate.   Electronically Signed   By: Lahoma Crocker M.D.   On: 10/13/2014 22:45   Old chart reviewed cxr reviewed poss right infiltrate no edema Case discussed with edp dr Joya Gaskins  Assessment/Plan  81 yo female with acute hypoxic and hypercarbic respiratory failure due to copde  Principal Problem:   Acute respiratory failure with hypoxia and hypercarbia-  This appears to be mostly respiratory with copde and possible early pna.  Cont bipap tonight.  Repeat abg in couple hours.  Already improving on bipap.  Treat underlying causes.  See below  Active Problems:   Diabetes-  q 4 hour ssi, npo for now while on bipap   Essential hypertension-  stable   Dyspnea-  Due to copde/early pna   Chronic diastolic CHF (congestive heart failure)-  Does not appear volume overloaded, but likely component.  In setting of worsening lactic acidosis, will hold diuretics and gentle ivf   COPD exacerbation-  Iv solumedrol, freq nebs.  Place on azithromycin iv.  Bipap.   Mild lactic acidosis -  Hold metformin, given a liter of ivf bolus in ed already but may have been after second lactic acid was pulled.  Will only give gentle ivf  overnight and serial for now due to patients tenous respiratory status and heart failure, do not want to fluid overload, pt clinically markedly improved with bipap.  Admit to stepdown.  Pt is full code.      Latiffany Harwick A 10/14/2014, 12:49 AM

## 2014-10-14 NOTE — Progress Notes (Signed)
   8:03 AM I agree with HPI/GPe and A/P per Dr. Steward Ros     81 y/o ? DM ty II, htn, HLD, Combined Diastolic & Systolic HF, COPD [still smoking 2-3 cig a day] h/o esophageal stricture & Adenomatous polyp h/o in the past admitted c SOB-Was given albuterol and place don BI-pap-given 2 hour long nebs with IV solu-medrol. Flet on admission to be REsp> CHF as Smoking h/o-Placed on continued Bipap   Principal Problem:   Multifactorial Acute respiratory failure with hypoxia and hypercarbia-  Grade 3-4 MMRC Dyspnea currently  Significant JVD - ~ 10 cm  See below  Abg last done 10/13/14=7.313/60-->Likely has a chronic retenttion component  Acute on chronic diastolic heart failure-  Last ECHO EF 45-50%, PA Peak 46 mm hg, Mild AoS Will need Diuresis-HOme dose lasix= 20 ?switch to Lasix IV 20 IV bid today  reassess trends in am c Bmet Daily weights Ok for [facility] low salt diet Acute exacerbation of COPD with current Tobacco Use  Unlikely to quit smoking  Agree c Solu-medrol short term  Taper rapidly to prednisone in 24-36 hrs to PO steroids  Cautious use Azithromycin  Lactic Acidosis  unclear if ty "A" vs ty "B"  Get Pro calcitonin to determine if indeed infectious  Cycle lactic acid  Rpt 2 Vw CXR stat   If concerns for RLL PNA-consider Aspiration and speech eval-if not see above   Iron deficiency anemia  Hb 10 and stable  Active Problems:   Diabetes   Essential hypertension   Memory loss   Dyspnea   Symptomatic anemia   Chronic diastolic CHF (congestive heart failure)   COPD exacerbation   Respiratory failure with hypoxia and hypercapnia     BP 115/62 mmHg  Pulse 96  Temp(Src) 98 F (36.7 C) (Oral)  Resp 18  Ht 5\' 2"  (1.575 m)  Wt 51.4 kg (113 lb 5.1 oz)  BMI 20.72 kg/m2  SpO2 99%  HEENT pleasant orietned talking in full snetences, knows place time and person CHEST no rales no rhonchi, no TVR/TVF CARDIAC s1 s2 tach, no m/r/g, JVD ~ 10 ABDOMEN soft, slightlye  distended NEURO intact SKIN/MUSCULAR powewr grossly 5/5

## 2014-10-15 ENCOUNTER — Inpatient Hospital Stay (HOSPITAL_COMMUNITY): Payer: Medicare Other

## 2014-10-15 DIAGNOSIS — I5033 Acute on chronic diastolic (congestive) heart failure: Secondary | ICD-10-CM

## 2014-10-15 DIAGNOSIS — J449 Chronic obstructive pulmonary disease, unspecified: Secondary | ICD-10-CM

## 2014-10-15 LAB — GLUCOSE, CAPILLARY
GLUCOSE-CAPILLARY: 148 mg/dL — AB (ref 65–99)
Glucose-Capillary: 116 mg/dL — ABNORMAL HIGH (ref 65–99)
Glucose-Capillary: 120 mg/dL — ABNORMAL HIGH (ref 65–99)
Glucose-Capillary: 141 mg/dL — ABNORMAL HIGH (ref 65–99)
Glucose-Capillary: 147 mg/dL — ABNORMAL HIGH (ref 65–99)
Glucose-Capillary: 214 mg/dL — ABNORMAL HIGH (ref 65–99)

## 2014-10-15 LAB — RENAL FUNCTION PANEL
Albumin: 3.4 g/dL — ABNORMAL LOW (ref 3.5–5.0)
Anion gap: 9 (ref 5–15)
BUN: 19 mg/dL (ref 6–20)
CALCIUM: 8.1 mg/dL — AB (ref 8.9–10.3)
CO2: 30 mmol/L (ref 22–32)
CREATININE: 0.8 mg/dL (ref 0.44–1.00)
Chloride: 105 mmol/L (ref 101–111)
GFR calc Af Amer: >60 mL/min (ref >60)
GFR calc non Af Amer: >60 mL/min (ref >60)
Glucose, Bld: 153 mg/dL — ABNORMAL HIGH (ref 65–99)
Phosphorus: 4.7 mg/dL — ABNORMAL HIGH (ref 2.5–4.6)
Potassium: 5.1 mmol/L (ref 3.5–5.1)
Sodium: 144 mmol/L (ref 135–145)

## 2014-10-15 LAB — PROCALCITONIN: PROCALCITONIN: 0.16 ng/mL

## 2014-10-15 MED ORDER — AMPICILLIN-SULBACTAM SODIUM 1.5 (1-0.5) G IJ SOLR
1.5000 g | Freq: Three times a day (TID) | INTRAMUSCULAR | Status: DC
  Administered 2014-10-15 – 2014-10-17 (×7): 1.5 g via INTRAVENOUS
  Filled 2014-10-15 (×10): qty 1.5

## 2014-10-15 MED ORDER — MORPHINE SULFATE (CONCENTRATE) 10 MG/0.5ML PO SOLN
5.0000 mg | ORAL | Status: DC | PRN
  Administered 2014-10-15: 5 mg via ORAL
  Filled 2014-10-15: qty 0.5

## 2014-10-15 MED ORDER — FUROSEMIDE 40 MG PO TABS
40.0000 mg | ORAL_TABLET | Freq: Two times a day (BID) | ORAL | Status: DC
  Administered 2014-10-15 – 2014-10-19 (×8): 40 mg via ORAL
  Filled 2014-10-15 (×9): qty 1

## 2014-10-15 NOTE — Progress Notes (Addendum)
Patient was placed on BIPAP by RN, patient in distress, SOB and increased WOB; RR 40-50's, MD called by RN. Patient states her breathing feels better being on BIPAP. Per RN morphine given. RT will continue to monitor.

## 2014-10-15 NOTE — Consult Note (Signed)
Name: Susan Browning MRN: [id number] DOB: 1920-05-21    ADMISSION DATE:  10/13/2014 CONSULTATION DATE:  7/6  REFERRING MD :  Verlon Au   CHIEF COMPLAINT:   Acute on chronic hypercarbic resp failure   BRIEF PATIENT DESCRIPTION:  81 yo female h/o copd, Admitted 7/5 w/ working dx of decompensated diastolic HF +/- AECOPD. PCCM asked to see on 7/6 when nursing raised concern for increased work of breathing and on IM service assessment found to have increased RR and accessory muscle use.    SIGNIFICANT EVENTS  7/6- off bipap  STUDIES:   SUBJECTIVE:  Off bipap, no distress Neg 1.77 liters  VITAL SIGNS: Temp:  [97.4 F (36.3 C)-98.4 F (36.9 C)] 98.2 F (36.8 C) (07/07 1230) Pulse Rate:  [89-208] 89 (07/07 1230) Resp:  [13-59] 27 (07/07 1230) BP: (122-170)/(62-99) 133/77 mmHg (07/07 1232) SpO2:  [90 %-100 %] 96 % (07/07 1232) FiO2 (%):  [40 %] 40 % (07/07 0808) Weight:  [50.576 kg (111 lb 8 oz)] 50.576 kg (111 lb 8 oz) (07/07 0319)  PHYSICAL EXAMINATION: General:  Frail 81 year old female, awake , no distress Neuro:  Oriented X 3 HEENT:  Lower JVD  Cardiovascular:  s1 s2 rrr Lungs:  Less coarse  Abdomen:  Soft, + bowel sounds  Musculoskeletal:  Intact  Skin:  Intact    Recent Labs Lab 10/14/14 0332 10/14/14 1940 10/15/14 0245  NA 143 144 144  K 2.9* 4.2 5.1  CL 105 104 105  CO2 28 30 30   BUN 13 15 19   CREATININE 0.71 0.62 0.80  GLUCOSE 238* 99 153*    Recent Labs Lab 10/13/14 2226 10/14/14 0332  HGB 11.5* 10.0*  HCT 36.5 32.4*  WBC 6.1 6.3  PLT 177 164    Recent Labs Lab 10/13/14 2226  10/14/14 0332 10/14/14 0957 10/14/14 0958 10/14/14 1415 10/14/14 1940 10/14/14 2220 10/15/14 0245  PROCALCITON  --   --   --  0.12  --   --   --   --  0.16  WBC 6.1  --  6.3  --   --   --   --   --   --   LATICACIDVEN  --   < >  --   --  4.1* 1.8 1.2 1.2  --   < > = values in this interval not displayed.  ABG    Component Value Date/Time   PHART 7.313*  10/13/2014 2236   PCO2ART 60.2* 10/13/2014 2236   PO2ART 134.0* 10/13/2014 2236   HCO3 30.5* 10/13/2014 2236   TCO2 32 10/13/2014 2236   O2SAT 99.0 10/13/2014 2236    Dg Chest 2 View  10/14/2014   CLINICAL DATA:  Episode of shortness of breath last evening, currently clinically improved  EXAM: CHEST  2 VIEW  COMPARISON:  Portable chest x-ray of October 13, 2014  FINDINGS: The cardiopericardial silhouette remains enlarged. The central pulmonary vascularity is mildly engorged and slightly more conspicuous today. The pulmonary interstitial markings are increased bilaterally as compared to the previous study. There remain areas of patchy increased density in the right infrahilar region. There is minimal blunting of the posterior costophrenic angles bilaterally. There are degenerative changes of the left glenohumeral joint. The thoracic spine is not well evaluated on the lateral view. No high-grade compression is observed.  IMPRESSION: COPD with low-grade CHF. When compared to the previous study the pulmonary vascularity and interstitial markings are slightly more conspicuous.   Electronically Signed  By: David  Martinique M.D.   On: 10/14/2014 08:20   Dg Chest Port 1 View  10/15/2014   CLINICAL DATA:  Follow-up of respiratory failure  EXAM: PORTABLE CHEST - 1 VIEW  COMPARISON:  PA and lateral chest of October 14, 2014  FINDINGS: The lungs are well-expanded. The interstitial markings are mildly increased but slightly less conspicuous today. The airspace opacity in the right lower lobe is slightly less conspicuous. There is stable patchy density in the left mid lung. There is no pleural effusion or pneumothorax. The cardiac silhouette remains enlarged. The pulmonary vascularity is prominent centrally but there is no cephalization of the vascular pattern. There is degenerative change of the left shoulder.  IMPRESSION: Slight interval improvement in the appearance of the airspace opacity in the right lower lobe. There is  stable cardiomegaly without significant pulmonary vascular engorgement.   Electronically Signed   By: David  Martinique M.D.   On: 10/15/2014 07:41   Dg Chest Port 1 View  10/13/2014   CLINICAL DATA:  Shortness of Breath  EXAM: PORTABLE CHEST - 1 VIEW  COMPARISON:  06/12/2014  FINDINGS: Cardiomegaly. There is streaky right infrahilar atelectasis or infiltrate. No pulmonary edema. Extensive degenerative changes left shoulder.  IMPRESSION: No pulmonary edema. Streaky right infrahilar atelectasis or infiltrate.   Electronically Signed   By: Lahoma Crocker M.D.   On: 10/13/2014 22:45    ASSESSMENT / PLAN:  Acute on chronic hypercarbic respiratory failure in setting of decompensated diastolic dysfxn w/ pulmonary edema; +/- AECOPD.  Baseline CO2 calculates to 54, currently 60.  Plan Change BIPAP to PRN, does not need to be scheduled Scheduled brovana and budesonide Cont systemic steroids, reduce further in am to pred Not impressed for infection, pct neg 2 , consider dc all abx or liit to 3 days Cont diuresis this is likely the most effective treatment we have provided, pcxr slight improved and neg balance 1.7 liters then off BIPAP She does not have a increased work of breathing any longer, BIPA P dc as scheduled Continue to work on code status, likely to not liberate from vent if ETT was required infuture Keep same regimen BDers  Will sign off, call if needed  [employer]. Titus Mould, MD, [place] Pgr: [facility]

## 2014-10-15 NOTE — Progress Notes (Signed)
Pt became diaphoretic and short of breath after turning over for a lab draw. Any movement continues to exacerbate her work of breathing. Pt is AAOx4. Morphine administered and Bipap applied. CCM and Dr. Baltazar Najjar made aware. Physician at the bedside.

## 2014-10-15 NOTE — Evaluation (Signed)
Clinical/Bedside Swallow Evaluation Patient Details  Name: Susan Browning MRN: [id number] Date of Birth: [birth year]  Today's Date: 10/15/2014 Time: SLP Start Time (ACUTE ONLY): 16 SLP Stop Time (ACUTE ONLY): 0919 SLP Time Calculation (min) (ACUTE ONLY): 7 min  Past Medical History:  Past Medical History  Diagnosis Date  . DIABETES MELLITUS, TYPE II 01/03/2007  . HYPERCHOLESTEROLEMIA 06/01/2009  . ANEMIA, IRON DEFICIENCY 08/15/2007  . LEUKOPENIA, CHRONIC 06/01/2009  . HYPERTENSION 01/03/2007  . ALLERGIC RHINITIS 01/03/2007  . OSTEOARTHRITIS 01/03/2007  . SHOULDER PAIN, LEFT 02/18/2008  . OSTEOPOROSIS 01/03/2007  . NONSPECIFIC ABNORMAL ELECTROCARDIOGRAM 02/12/2007  . COLONIC POLYPS, HX OF 01/03/2007  . Diabetic retinopathy   . Smoker   . OA (osteoarthritis)    Past Surgical History:  Past Surgical History  Procedure Laterality Date  . Child birth      x's 5  . Dilation and curettage of uterus     HPI:  81 yo female h/o COPD, DM, HTN, combined CHF, comes in with SOB that occurred this afternoon at home.Required Bipap. CXR slight interval improvement in the appearance of the airspace opacity in the right lower lobe. There is stable cardiomegaly without significant pulmonary vascular engorgement.   Assessment / Plan / Recommendation Clinical Impression  Oropharyngeal swallow function appeared within normal limits without s/s aspiration, pharyngeal weakness or residue. Pt states she does not have difficulty masticating regular textures despite not using dentures. Upgrade texture to regular, continue thin, straws allowed and pills with liquid. No follow up ST needed.      Aspiration Risk  Mild    Diet Recommendation Age appropriate regular solids;Thin   Medication Administration: Whole meds with liquid Compensations: Slow rate;Small sips/bites    Other  Recommendations Oral Care Recommendations: Oral care BID   Follow Up Recommendations       Frequency and Duration         Pertinent Vitals/Pain none         Swallow Study          Oral/Motor/Sensory Function Overall Oral Motor/Sensory Function: Appears within functional limits for tasks assessed   Ice Chips Ice chips: Not tested   Thin Liquid Thin Liquid: Within functional limits Presentation: Cup;Straw    Nectar Thick Nectar Thick Liquid: Not tested   Honey Thick Honey Thick Liquid: Not tested   Puree Puree: Not tested   Solid   GO    Solid: Within functional limits       Mick Sell, Ayesha Rumpf M.Ed [employer] [phone]   10/15/2014,9:31 AM

## 2014-10-15 NOTE — Significant Event (Signed)
~   18 min discussion as conference with HCPOA Marquis Lunch on cell phone & Eldest Son at bedside and patient Presented with the realities of what ACLS would look like, patient prefers not to have Chest compressions nor Intubation She is hesitant about Bipap but says that would be "okay" -I have asked her and her family to think hard about this over the next 24 hours -we will have a MOST form reviewed with family in am -greatly appreciate CCM involvement  Verneita Griffes, MD [facility] [phone]

## 2014-10-15 NOTE — Progress Notes (Signed)
RN scanned med on her MAR, and RT administered it and charted

## 2014-10-15 NOTE — Progress Notes (Signed)
Susan Browning UXL:[id number] DOB: Aug 07, 1920 DOA: 10/13/2014 PCP: Renato Shin, MD  Brief narrative: 81 y/o ? DM ty II, htn, HLD, Combined Diastolic & Systolic HF, COPD [still smoking 2-3 cig a day] h/o esophageal stricture & Adenomatous polyp h/o in the past admitted c SOB-Was given albuterol and place don BI-pap-given 2 hour long nebs with IV solu-medrol. Felt on admission to be Resp> CHF as Smoking h/o-Placed on continued Bipap  Past medical history-As per Problem list Chart reviewed as below- reviewed  Consultants:  CCM MD  Procedures:   BIpap  Antibiotics:  Unasyn 10/15/14   Subjective   Alert awake Desat ~ 02:00  tol Bipap fair  No cp No n/v/f   Objective    Interim History:   Telemetry: SInus tach, PVC, ? Afib   Objective: Filed Vitals:   10/15/14 0319 10/15/14 0400 10/15/14 0500 10/15/14 0600  BP:  136/66 122/62 132/74  Pulse:  99 89 94  Temp:      TempSrc:      Resp:  19 13 18   Height:      Weight: 50.576 kg (111 lb 8 oz)     SpO2:  99% 100% 98%    Intake/Output Summary (Last 24 hours) at 10/15/14 0751 Last data filed at 10/14/14 1948  Gross per 24 hour  Intake      0 ml  Output   1850 ml  Net  -1850 ml    Exam:  General: eomi, ncat Cardiovascular: s1 s 2 no m/r/g Respiratory:  Clear no added sound Abdomen: soft nt nd no rebound Skin no le edema Neuro intact  Data Reviewed: Basic Metabolic Panel:  Recent Labs Lab 10/13/14 2226 10/14/14 0332 10/14/14 0500 10/14/14 1940 10/15/14 0245  NA 141 143  --  144 144  K 3.5 2.9*  --  4.2 5.1  CL 104 105  --  104 105  CO2 28 28  --  30 30  GLUCOSE 105* 238*  --  99 153*  BUN 14 13  --  15 19  CREATININE 0.71 0.71  --  0.62 0.80  CALCIUM 8.0* 7.9*  --  8.4* 8.1*  MG  --   --  1.8 1.2*  --   PHOS  --   --   --   --  4.7*   Liver Function Tests:  Recent Labs Lab 10/13/14 2226 10/15/14 0245  AST 78*  --   ALT 38  --   ALKPHOS 100  --   BILITOT 0.7  --   PROT 6.7  --     ALBUMIN 3.5 3.4*   No results for input(s): LIPASE, AMYLASE in the last 168 hours. No results for input(s): AMMONIA in the last 168 hours. CBC:  Recent Labs Lab 10/13/14 2226 10/14/14 0332  WBC 6.1 6.3  HGB 11.5* 10.0*  HCT 36.5 32.4*  MCV 92.9 92.0  PLT 177 164   Cardiac Enzymes: No results for input(s): CKTOTAL, CKMB, CKMBINDEX, TROPONINI in the last 168 hours. BNP: Invalid input(s): POCBNP CBG:  Recent Labs Lab 10/14/14 1216 10/14/14 1621 10/14/14 2003 10/15/14 0002 10/15/14 0355  GLUCAP 135* 138* 88 120* 147*    Recent Results (from the past 240 hour(s))  MRSA PCR Screening     Status: None   Collection Time: 10/14/14  2:55 AM  Result Value Ref Range Status   MRSA by PCR NEGATIVE NEGATIVE Final    Comment:        The GeneXpert MRSA Assay (  FDA approved for NASAL specimens only), is one component of a comprehensive MRSA colonization surveillance program. It is not intended to diagnose MRSA infection nor to guide or monitor treatment for MRSA infections.      Studies:              All Imaging reviewed and is as per above notation   Scheduled Meds: . ampicillin-sulbactam (UNASYN) IV  1.5 g Intravenous Q8H  . antiseptic oral rinse  15 mL Mouth Rinse 6 times per day  . arformoterol  15 mcg Nebulization BID  . azithromycin  250 mg Oral Daily  . budesonide  0.5 mg Nebulization BID  . diltiazem  30 mg Oral BID  . enoxaparin (LOVENOX) injection  30 mg Subcutaneous Q24H  . insulin aspart  0-9 Units Subcutaneous 6 times per day  . methylPREDNISolone (SOLU-MEDROL) injection  40 mg Intravenous Q12H   Continuous Infusions:    Assessment/Plan:  Principal Problem:   Acute respiratory failure with hypoxia and hypercarbia Active Problems:   Diabetes   Iron deficiency anemia   Essential hypertension   Memory loss   Dyspnea   Symptomatic anemia   Chronic diastolic CHF (congestive heart failure)   COPD exacerbation   Respiratory failure with hypoxia and  hypercapnia   Acute exacerbation of COPD with asthma   Acute on chronic diastolic heart failure    Multifactorial Acute respiratory failure with hypoxia and hypercarbia-  Decompensated over the course of 7/6-10/15/14  CCM Consulted-agree with initial recommendations- poor likely candidate for aggressive ACLS which would be barbaric to her.  I have discussed my findings with family  She will come off Bipap today for meals and we will keep her in SDU and reassess her later today   Grade 3-4 MMRC Dyspnea currently  Significant JVD ~ 10 cm  See below Acute on chronic diastolic heart failure-  Last ECHO EF 45-50%, PA Peak 46 mm hg, Mild AoS  Will need Diuresis-HOme dose lasix= 20 ?switched to Lasix IV 40  Still needs diuresis  I/o = -1.25 cummulatively  Switch to PO lasix 40 bid with strict I/o  reassess trends in am c Bmet  Daily weights  Ok for [facility] low salt diet Acute exacerbation of COPD with current Tobacco Use  Unlikely to quit smoking  Agree c Solu-medrol short term  Taper rapidly to prednisone in 24-36 hrs to PO steroids  Cautious use Azithromycin Lactic Acidosis  unclear if ty "A" vs ty "B"  Pro calcitonin was neg]  lactic acidosis cleared on 7/6  Rpt 2 Vw CXR 10/15/14 shows RLL PNA-   speech eval was neg for any concern for aspiration--Am still Rx for PNA from aspiration source  Low dose CT as OP r/o Cancer [heavy smoker h/o]  Iron deficiency anemia  Hb 10 and stable  Code Status: Full Family Communication: discussed in detail 10/15/14 Disposition Plan:  Inpatient-Tx to SDU in am vs tele if stabilizes    Verneita Griffes, MD  [facility] Pager [phone] 10/15/2014, 7:51 AM    LOS: 1 day

## 2014-10-15 NOTE — Progress Notes (Signed)
Bipap taken off patient for breakfast. Nasal cannula at 6L placed. Patient comfortable, no distress, no shortness of breath. O2 sats at 99%. Will continue to monitor.  - Soyla Dryer, RN

## 2014-10-15 NOTE — Progress Notes (Signed)
Pt is comfortable at this time. No Resp Distress. Vitals: HR: 96, RR: 18, Sats: 99% on 5L. RT will continue to monitor.

## 2014-10-16 DIAGNOSIS — I5032 Chronic diastolic (congestive) heart failure: Secondary | ICD-10-CM

## 2014-10-16 DIAGNOSIS — J9601 Acute respiratory failure with hypoxia: Secondary | ICD-10-CM

## 2014-10-16 DIAGNOSIS — Z515 Encounter for palliative care: Secondary | ICD-10-CM

## 2014-10-16 LAB — CBC WITH DIFFERENTIAL/PLATELET
BASOS PCT: 0 % (ref 0–1)
Basophils Absolute: 0 10*3/uL (ref 0.0–0.1)
EOS ABS: 0 10*3/uL (ref 0.0–0.7)
EOS PCT: 0 % (ref 0–5)
HCT: 34.7 % — ABNORMAL LOW (ref 36.0–46.0)
Hemoglobin: 10.7 g/dL — ABNORMAL LOW (ref 12.0–15.0)
LYMPHS ABS: 0.2 10*3/uL — AB (ref 0.7–4.0)
Lymphocytes Relative: 3 % — ABNORMAL LOW (ref 12–46)
MCH: 29.3 pg (ref 26.0–34.0)
MCHC: 30.8 g/dL (ref 30.0–36.0)
MCV: 95.1 fL (ref 78.0–100.0)
Monocytes Absolute: 0.1 10*3/uL (ref 0.1–1.0)
Monocytes Relative: 1 % — ABNORMAL LOW (ref 3–12)
NEUTROS PCT: 96 % — AB (ref 43–77)
Neutro Abs: 5.9 10*3/uL (ref 1.7–7.7)
PLATELETS: 161 10*3/uL (ref 150–400)
RBC: 3.65 MIL/uL — AB (ref 3.87–5.11)
RDW: 20.4 % — ABNORMAL HIGH (ref 11.5–15.5)
WBC: 6.2 10*3/uL (ref 4.0–10.5)

## 2014-10-16 LAB — GLUCOSE, CAPILLARY
GLUCOSE-CAPILLARY: 294 mg/dL — AB (ref 65–99)
Glucose-Capillary: 120 mg/dL — ABNORMAL HIGH (ref 65–99)
Glucose-Capillary: 163 mg/dL — ABNORMAL HIGH (ref 65–99)
Glucose-Capillary: 118 mg/dL — ABNORMAL HIGH (ref 65–99)
Glucose-Capillary: 261 mg/dL — ABNORMAL HIGH (ref 65–99)

## 2014-10-16 LAB — RENAL FUNCTION PANEL
ANION GAP: 5 (ref 5–15)
Albumin: 3.3 g/dL — ABNORMAL LOW (ref 3.5–5.0)
BUN: 31 mg/dL — ABNORMAL HIGH (ref 6–20)
CHLORIDE: 105 mmol/L (ref 101–111)
CO2: 32 mmol/L (ref 22–32)
Calcium: 8 mg/dL — ABNORMAL LOW (ref 8.9–10.3)
Creatinine, Ser: 0.84 mg/dL (ref 0.44–1.00)
GFR, EST NON AFRICAN AMERICAN: 59 mL/min — AB (ref >60)
Glucose, Bld: 153 mg/dL — ABNORMAL HIGH (ref 65–99)
POTASSIUM: 5.1 mmol/L (ref 3.5–5.1)
Phosphorus: 4.3 mg/dL (ref 2.5–4.6)
Sodium: 142 mmol/L (ref 135–145)

## 2014-10-16 LAB — BASIC METABOLIC PANEL
ANION GAP: 8 (ref 5–15)
BUN: 31 mg/dL — ABNORMAL HIGH (ref 6–20)
CHLORIDE: 107 mmol/L (ref 101–111)
CO2: 30 mmol/L (ref 22–32)
CREATININE: 0.91 mg/dL (ref 0.44–1.00)
Calcium: 8.4 mg/dL — ABNORMAL LOW (ref 8.9–10.3)
GFR calc Af Amer: >60 mL/min (ref >60)
GFR calc non Af Amer: 54 mL/min — ABNORMAL LOW (ref >60)
Glucose, Bld: 279 mg/dL — ABNORMAL HIGH (ref 65–99)
Potassium: 5.5 mmol/L — ABNORMAL HIGH (ref 3.5–5.1)
Sodium: 145 mmol/L (ref 135–145)

## 2014-10-16 LAB — PROCALCITONIN: PROCALCITONIN: 0.12 ng/mL

## 2014-10-16 MED ORDER — PREDNISONE 20 MG PO TABS
40.0000 mg | ORAL_TABLET | Freq: Every day | ORAL | Status: DC
  Administered 2014-10-16 – 2014-10-17 (×2): 40 mg via ORAL
  Filled 2014-10-16 (×3): qty 2

## 2014-10-16 MED ORDER — BUDESONIDE 0.5 MG/2ML IN SUSP
0.5000 mg | Freq: Two times a day (BID) | RESPIRATORY_TRACT | Status: DC
  Administered 2014-10-16 – 2014-10-19 (×5): 0.5 mg via RESPIRATORY_TRACT
  Filled 2014-10-16 (×10): qty 2

## 2014-10-16 NOTE — Care Management (Signed)
Important Message  Patient Details  Name: CAYMAN BROGDEN MRN: [id number] Date of Birth: [birth year]   Medicare Important Message Given:  Yes-second notification given    Dawayne Patricia, RN 10/16/2014, 12:12 PM

## 2014-10-16 NOTE — Progress Notes (Signed)
Spoke with daughter [name] re: Palliative Care Consult. I just had the pleasure of meeting the [facility] 2 months ago regarding their father's declining health and Palliative Care consult with the family. Mr. Bohle has passed approx 28m ago per [facility]. She would like to meet tomorrow. She is calling her siblings and will get back in touch with me for a time for further goals of care discussion tomorrow. Thank you,  Romona Curls, ANP

## 2014-10-16 NOTE — Evaluation (Signed)
Physical Therapy Evaluation Patient Details Name: SHERRYN POLLINO MRN: [id number] DOB: 1921/02/23 Today's Date: 10/16/2014   History of Present Illness  81 yo female h/o copd, dm, htn, combined chf, comes in with sob that occurred 7/5.   Clinical Impression  Pt with fatigue limiting activity today and reports being able to normally move around on her own and care for herself with continued decline in activity tolerance lately but unable to state time frame. Extensive family support for home and states her bed and bath are nearby but [facility] would be beneficial for transfers. Recommend continued RW use for all gait and she will benefit from acute therapy to maximize gait, mobility, balance and function to decrease burden of care.     Follow Up Recommendations Home health PT;Supervision for mobility/OOB    Equipment Recommendations  3in1 (PT)    Recommendations for Other Services       Precautions / Restrictions Precautions Precautions: Fall      Mobility  Bed Mobility Overal bed mobility: Modified Independent             General bed mobility comments: with use of rail   Transfers Overall transfer level: Needs assistance   Transfers: Sit to/from Stand Sit to Stand: Min guard         General transfer comment: cues for hand placement with guarding for lines and balance  Ambulation/Gait Ambulation/Gait assistance: Min assist Ambulation Distance (Feet): 10 Feet Assistive device: None Gait Pattern/deviations: Step-through pattern;Decreased stride length;Trunk flexed   Gait velocity interpretation: Below normal speed for age/gender General Gait Details: pt reaching out for environmental supports and was able to walk 10' x 2 but fatigued and denied further ambulation. Recommend RW for all future gait trials.   Stairs            Wheelchair Mobility    Modified Rankin (Stroke Patients Only)       Balance Overall balance assessment: Needs assistance   Sitting  balance-Leahy Scale: Good       Standing balance-Leahy Scale: Fair                               Pertinent Vitals/Pain Pain Assessment: No/denies pain  HR 100 sats 93% on 3L with drop with activity or attempts on RA    Home Living Family/patient expects to be discharged to:: Private residence Living Arrangements: Children;Other relatives Available Help at Discharge: Family;Available 24 hours/day Type of Home: House Home Access: Stairs to enter Entrance Stairs-Rails: None Entrance Stairs-Number of Steps: 2 Home Layout: One level Home Equipment: Cane - single point;Walker - 2 wheels      Prior Function Level of Independence: Independent               Hand Dominance        Extremity/Trunk Assessment   Upper Extremity Assessment: Generalized weakness           Lower Extremity Assessment: Generalized weakness      Cervical / Trunk Assessment: Kyphotic  Communication   Communication: HOH  Cognition Arousal/Alertness: Awake/alert Behavior During Therapy: WFL for tasks assessed/performed Overall Cognitive Status: Within Functional Limits for tasks assessed                      General Comments      Exercises        Assessment/Plan    PT Assessment Patient needs continued PT services  PT Diagnosis  Difficulty walking;Generalized weakness   PT Problem List Decreased activity tolerance;Decreased balance;Decreased mobility;Cardiopulmonary status limiting activity;Decreased strength  PT Treatment Interventions Gait training;Functional mobility training;Therapeutic activities;Patient/family education;DME instruction   PT Goals (Current goals can be found in the Care Plan section) Acute Rehab PT Goals Patient Stated Goal: return home with family PT Goal Formulation: With patient/family Time For Goal Achievement: 10/30/14 Potential to Achieve Goals: Good    Frequency Min 3X/week   Barriers to discharge        Co-evaluation                End of Session   Activity Tolerance: Patient limited by fatigue Patient left: in chair;with call bell/phone within reach;with family/visitor present Nurse Communication: Mobility status         Time: 9458-5929 PT Time Calculation (min) (ACUTE ONLY): 18 min   Charges:   PT Evaluation $Initial PT Evaluation Tier I: 1 Procedure     PT G CodesMelford Aase 10/16/2014, 11:23 AM Elwyn Reach, [place]

## 2014-10-16 NOTE — Progress Notes (Signed)
Pt has not required BIPAP throughout night. Pt has rested. No distress. RT will continue to monitor. BIPAP at bedside.

## 2014-10-16 NOTE — Progress Notes (Signed)
RT assessment: Pt is resting comfortably at this time. No distress. No SOB. Vitals: HR: 103, RR: 28, Sats: 98% (3L). No need for BIPAP at this time. BIPAP available at bedside. RN/RT will continue to monitor.

## 2014-10-16 NOTE — Progress Notes (Signed)
VALETTA MULROY KDT:[id number] DOB: [birth year] DOA: 10/13/2014 PCP: Renato Shin, MD  Brief narrative: 81 y/o ? DM ty II, htn, HLD, Combined Diastolic & Systolic HF, COPD [still smoking 2-3 cig a day] h/o esophageal stricture & Adenomatous polyp h/o in the past admitted c SOB-Was given albuterol and place don BI-pap-given 2 hour long nebs with IV solu-medrol. Felt on admission to be Resp> CHF as Smoking h/o-Placed on continued Bipap Ultimately felt Multifactorial Dyspnoea, more in keeping c decompnesated CHF > COPD Pulmonology consulted regarding care and options as seemed Bi-papa dependant during early hospital course  Past medical history-As per Problem list Chart reviewed as below- reviewed  Consultants:  CCM MD  Procedures:   BIpap  Antibiotics:  Unasyn 10/15/14   Subjective   Didn't sleep much overnight Comfortable however tol breakfast No n/v/cp/sob at present and didn;t need Bipap overnight Anxiety last pm resolved by oral morphine solution 0 to patient's daughter does not feel that patient would want psychiatry input at this stage 4 bereavement, pleasant   Objective    Interim History:   Telemetry: SInus tach, PVC, ? Afib   Objective: Filed Vitals:   10/16/14 0600 10/16/14 0700 10/16/14 0800 10/16/14 0908  BP: 124/69  149/59   Pulse: 88 98 98 104  Temp:   98 F (36.7 C)   TempSrc:   Axillary   Resp: 15 18 18 17   Height:      Weight:      SpO2: 100% 100% 97% 99%    Intake/Output Summary (Last 24 hours) at 10/16/14 0934 Last data filed at 10/16/14 0000  Gross per 24 hour  Intake    150 ml  Output    200 ml  Net    -50 ml    Exam:  General: eomi, ncat0 Cardiovascular: s1 s 2 no m/r/g Respiratory:  Clear no added sound Abdomen: soft nt  30 minute discussion with daughter at the bedside regarding goals of care and ultimately decided on limited CODE STATUS without intubation without cardioversion30 minute discussion with daughter at the bedside  regarding goals of care and ultimately decided on limited CODE STATUS without intubation without cardioversion without ACLS protocols but BiPAP okay. Patient would not likely want to have psychiatry input. Palliative medicine consult agreed upon and pending. nd no rebound Skin no le edema Neuro intact  Data Reviewed: Basic Metabolic Panel:  Recent Labs Lab 10/13/14 2226 10/14/14 0332 10/14/14 0500 10/14/14 1940 10/15/14 0245 10/16/14 0400  NA 141 143  --  144 144 142  K 3.5 2.9*  --  4.2 5.1 5.1  CL 104 105  --  104 105 105  CO2 28 28  --  30 30 32  GLUCOSE 105* 238*  --  99 153* 153*  BUN 14 13  --  15 19 31*  CREATININE 0.71 0.71  --  0.62 0.80 0.84  CALCIUM 8.0* 7.9*  --  8.4* 8.1* 8.0*  MG  --   --  1.8 1.2*  --   --   PHOS  --   --   --   --  4.7* 4.3   Liver Function Tests:  Recent Labs Lab 10/13/14 2226 10/15/14 0245 10/16/14 0400  AST 78*  --   --   ALT 38  --   --   ALKPHOS 100  --   --   BILITOT 0.7  --   --   PROT 6.7  --   --   ALBUMIN 3.5  3.4* 3.3*   No results for input(s): LIPASE, AMYLASE in the last 168 hours. No results for input(s): AMMONIA in the last 168 hours. CBC:  Recent Labs Lab 10/13/14 2226 10/14/14 0332 10/16/14 0400  WBC 6.1 6.3 6.2  NEUTROABS  --   --  5.9  HGB 11.5* 10.0* 10.7*  HCT 36.5 32.4* 34.7*  MCV 92.9 92.0 95.1  PLT 177 164 161   Cardiac Enzymes: No results for input(s): CKTOTAL, CKMB, CKMBINDEX, TROPONINI in the last 168 hours. BNP: Invalid input(s): POCBNP CBG:  Recent Labs Lab 10/15/14 1728 10/15/14 2051 10/15/14 2318 10/16/14 0347 10/16/14 0801  GLUCAP 214* 148* 120* 163* 118*    Recent Results (from the past 240 hour(s))  MRSA PCR Screening     Status: None   Collection Time: 10/14/14  2:55 AM  Result Value Ref Range Status   MRSA by PCR NEGATIVE NEGATIVE Final    Comment:        The GeneXpert MRSA Assay (FDA approved for NASAL specimens only), is one component of a comprehensive MRSA  colonization surveillance program. It is not intended to diagnose MRSA infection nor to guide or monitor treatment for MRSA infections.      Studies:              All Imaging reviewed and is as per above notation   Scheduled Meds: . ampicillin-sulbactam (UNASYN) IV  1.5 g Intravenous Q8H  . antiseptic oral rinse  15 mL Mouth Rinse 6 times per day  . arformoterol  15 mcg Nebulization BID  . budesonide (PULMICORT) nebulizer solution  0.5 mg Nebulization BID  . diltiazem  30 mg Oral BID  . enoxaparin (LOVENOX) injection  30 mg Subcutaneous Q24H  . furosemide  40 mg Oral BID  . insulin aspart  0-9 Units Subcutaneous 6 times per day  . predniSONE  40 mg Oral QAC breakfast   Continuous Infusions:    Assessment/Plan:  Principal Problem:   Acute respiratory failure with hypoxia and hypercarbia Active Problems:   Diabetes   Iron deficiency anemia   Essential hypertension   Memory loss   Dyspnea   Symptomatic anemia   Chronic diastolic CHF (congestive heart failure)   COPD exacerbation   Respiratory failure with hypoxia and hypercapnia   Acute exacerbation of COPD with asthma   Acute on chronic diastolic heart failure    Multifactorial Acute respiratory failure with hypoxia and hypercarbia-  Decompensated over the course of 7/6-10/15/14  CCM Consulted-agree with initial recommendations- poor likely candidate for aggressive ACLS which would be barbaric to her.  I have discussed my findings with family  She will come off Bipap today for meals and we will keep her in SDU and reassess her later today   Grade 3-4 MMRC Dyspnea currently  Significant JVD ~ 10 cm  See below Potentital Aspiration PNA  RLL opacities persist  Started UNascy and dc Azithro 7/7  Rpt CXR 7/9 am Acute on chronic diastolic heart failure-Coponent ? Takotsubo CM 2/2 to death f her husband about 1 month ago prior after 71 years of marriage  Last ECHO EF 45-50%, PA Peak 46 mm hg, Mild AoS  Will  need Diuresis-HOme dose lasix= 20 ?switched to Lasix IV 40--now 0 po bid 10/16/14  Still needs diuresis  I/o = -1.25 cummulatively  reassess trends in am c Bmet  Daily weights  Ok for [facility] low salt diet Acute exacerbation of COPD with current Tobacco Use  Unlikely to quit smoking  Solu-medrol short term Taper rapidly to prednisone 60 7/8 after 2 days IV steroids Lactic Acidosis  unclear if ty "A" vs ty "B"  Pro calcitonin was neg]  lactic acidosis cleared on 7/6  Rpt 2 Vw CXR 10/15/14 shows RLL PNA-   speech eval was neg for any concern for aspiration--Am still Rx for PNA from aspiration source  Low dose CT as OP r/o Cancer [heavy smoker h/o]  Iron deficiency anemia  Hb 10 and stable  Code Status: Full Family Communication: discussed in detail 10/15/14   Appreciate input in advance.30 minute discussion with daughter at the bedside regarding goals of care and ultimately decided on limited CODE STATUS without intubation without cardioversion without ACLS protocols but BiPAP okay. Patient would not likely want to have psychiatry input. Palliative medicine consult agreed upon and pending. Appreciate input in advance.  Disposition Plan:  Inpatient-Keep SDU for now until [place] done     Verneita Griffes, MD  [facility] Pager [phone] 10/16/2014, 9:34 AM    LOS: 2 days

## 2014-10-17 LAB — CBC WITH DIFFERENTIAL/PLATELET
Basophils Absolute: 0 10*3/uL (ref 0.0–0.1)
Basophils Relative: 0 % (ref 0–1)
EOS PCT: 0 % (ref 0–5)
Eosinophils Absolute: 0 10*3/uL (ref 0.0–0.7)
HEMATOCRIT: 34.5 % — AB (ref 36.0–46.0)
Hemoglobin: 10.6 g/dL — ABNORMAL LOW (ref 12.0–15.0)
LYMPHS ABS: 0.3 10*3/uL — AB (ref 0.7–4.0)
Lymphocytes Relative: 5 % — ABNORMAL LOW (ref 12–46)
MCH: 29.2 pg (ref 26.0–34.0)
MCHC: 30.7 g/dL (ref 30.0–36.0)
MCV: 95 fL (ref 78.0–100.0)
Monocytes Absolute: 0.5 10*3/uL (ref 0.1–1.0)
Monocytes Relative: 8 % (ref 3–12)
Neutro Abs: 5.5 10*3/uL (ref 1.7–7.7)
Neutrophils Relative %: 87 % — ABNORMAL HIGH (ref 43–77)
PLATELETS: 159 10*3/uL (ref 150–400)
RBC: 3.63 MIL/uL — ABNORMAL LOW (ref 3.87–5.11)
RDW: 20.2 % — ABNORMAL HIGH (ref 11.5–15.5)
WBC: 6.3 10*3/uL (ref 4.0–10.5)

## 2014-10-17 LAB — GLUCOSE, CAPILLARY
GLUCOSE-CAPILLARY: 114 mg/dL — AB (ref 65–99)
GLUCOSE-CAPILLARY: 48 mg/dL — AB (ref 65–99)
GLUCOSE-CAPILLARY: 85 mg/dL (ref 65–99)
GLUCOSE-CAPILLARY: 94 mg/dL (ref 65–99)
Glucose-Capillary: 89 mg/dL (ref 65–99)
Glucose-Capillary: 187 mg/dL — ABNORMAL HIGH (ref 65–99)
Glucose-Capillary: 331 mg/dL — ABNORMAL HIGH (ref 65–99)
Glucose-Capillary: 322 mg/dL — ABNORMAL HIGH (ref 65–99)
Glucose-Capillary: 49 mg/dL — ABNORMAL LOW (ref 65–99)

## 2014-10-17 LAB — COMPREHENSIVE METABOLIC PANEL
ALT: 39 U/L (ref 14–54)
AST: 40 U/L (ref 15–41)
Albumin: 3.2 g/dL — ABNORMAL LOW (ref 3.5–5.0)
Alkaline Phosphatase: 71 U/L (ref 38–126)
Anion gap: 9 (ref 5–15)
BUN: 28 mg/dL — ABNORMAL HIGH (ref 6–20)
CO2: 34 mmol/L — AB (ref 22–32)
CREATININE: 0.65 mg/dL (ref 0.44–1.00)
Calcium: 8.3 mg/dL — ABNORMAL LOW (ref 8.9–10.3)
Chloride: 102 mmol/L (ref 101–111)
GLUCOSE: 69 mg/dL (ref 65–99)
Potassium: 4.7 mmol/L (ref 3.5–5.1)
Sodium: 145 mmol/L (ref 135–145)
Total Bilirubin: 0.4 mg/dL (ref 0.3–1.2)
Total Protein: 5.7 g/dL — ABNORMAL LOW (ref 6.5–8.1)

## 2014-10-17 MED ORDER — AMOXICILLIN-POT CLAVULANATE ER 1000-62.5 MG PO TB12
2.0000 | ORAL_TABLET | Freq: Two times a day (BID) | ORAL | Status: DC
  Administered 2014-10-17 – 2014-10-19 (×4): 2 via ORAL
  Filled 2014-10-17 (×6): qty 2

## 2014-10-17 MED ORDER — PREDNISONE 20 MG PO TABS
20.0000 mg | ORAL_TABLET | Freq: Every day | ORAL | Status: DC
  Administered 2014-10-18 – 2014-10-19 (×2): 20 mg via ORAL
  Filled 2014-10-17 (×3): qty 1

## 2014-10-17 NOTE — Progress Notes (Signed)
Pt's CBG = 49 pt asymptomatic.  Rechecked =48.  Pt was 322 at 2000 CBG.  Gave 15 gm snack and will recheck in 15.  Daughter at bedside. Will continue to monitor. Saunders Revel T

## 2014-10-17 NOTE — Progress Notes (Signed)
RT Assessment: Pt has not required BIPAP any throughout night. Pt has rested comfortably without and distress. Vitals: HR: 96, RR: 21, Sats: 97% (3L [place]). BIPAP will be pulled from room today. Order is PRN.

## 2014-10-17 NOTE — Consult Note (Signed)
Consultation Note Date: 10/17/2014   Patient Name: Susan Browning  DOB: 04/11/1920  MRN: [id number]  Age / Sex: 81 y.o., female   PCP: Renato Shin, MD Referring Physician: Nita Sells, MD  Reason for Consultation: Establishing goals of care  Palliative Care Assessment and Plan Summary of Established Goals of Care and Medical Treatment Preferences   Clinical Assessment/Narrative: Pt is a 81 yo female with h/o COPD, DM, HTN, combined CHF, admitted with dyspnea. She required bipap in ED. DX with RLL pna as well as CHF decompensation. Pt recently lost her husband who was also seen by our service. Per pt, "everything has hit me all at once".  She is alert, becomes tearful with the mention of the loss of her husband. She states she feels better, but still very weak. She has been OOB to recliner and working with PT. Can use a rolling walker. Eating 50% of meals and can feed herself Dr. Verlon Au met with pt's daughter, [name], and discussed advanced care planning specifically DNR, BiPAP, CPR, defibrillation as well as ACLS protocols. Met with other siblings as well as Malinda today to discuss disposition options and disease progression r/t CHF and what they could anticipate. Mrs. Jimmye Norman as well as two of her brothers had to briefly leave the conversation as they became tearful and overwhelmed.Marland Kitchen Specific issues addressed:  1. Continue partial code in the hospital. Family would want BiPAP again if she were to decompensate. Will need MOST form or DNR completed prior to transfer home which I can do with family tomorrow.  2. Home with hospice care. I am hoping that [facility] can establish a relationship with their hospice team to help them navigate future disease exacerbations. At this point, family is not ready to say they would not want future hospitalizations with CHF or COPD exacerbations, especially in the setting of the death of their father about 1 month ago.    Contacts/Participants in Discussion: Primary Decision Maker: [name]  HCPOA: no  Family makes joint decision but look to [facility] for guidance  Code Status/Advance Care Planning:  Partial Code in hospital setting in order to utilize BiPAP if needed. Will be DNR in community setting  Symptom Management:   Bereavement: Pt's husband died approx 1 month ago after being together for years. She is very tearful. Family views this as a normal reaction to a very difficult situation which is accurate. Will revisit tomorrow with FU appt tomorow. I wouldn't be surprised if an anti-depressant helped Mrs. Attwood moving forward especially in light of CHF exacerbation. Functional decline as well as loss of her husband could impede what recovery she can achieve. Perhaps something viewed as for a short term option would be perceived as reasonable to family if helped her to be more at peace  Dyspnea; Utilizing low dose MS04 prn without adverse effects. Recommend continuing PRN approach for now  Additional Recommendations (Limitations, Scope, Preferences):  n/a Psycho-social/Spiritual:   Support System: Extensive family support  Desire for further Chaplaincy support:no  Prognosis: < 6 months  Discharge Planning:  Home with Hospice       Chief Complaint/History of Present Illness: Pt is a 81 yo female with h/o COPD and combined CHF admitted with increasing dyspnea. CXR revealed pna as well as CHF exacerbation.   Primary Diagnoses  Present on Admission:  . Chronic diastolic CHF (congestive heart failure) . COPD exacerbation . Essential hypertension . Dyspnea . Acute respiratory failure with hypoxia and hypercarbia . Acute exacerbation of  COPD with asthma . Acute on chronic diastolic heart failure . Symptomatic anemia . Memory loss . Iron deficiency anemia  Palliative Review of Systems: She denies pain . Denies dyspnea at rest but does acknowledge she gets "winded" with a lot  of company and exertion. She is tearful when I offered condolences on the loss of her husband. I have reviewed the medical record, interviewed the patient and family, and examined the patient. The following aspects are pertinent.  Past Medical History  Diagnosis Date  . DIABETES MELLITUS, TYPE II 01/03/2007  . HYPERCHOLESTEROLEMIA 06/01/2009  . ANEMIA, IRON DEFICIENCY 08/15/2007  . LEUKOPENIA, CHRONIC 06/01/2009  . HYPERTENSION 01/03/2007  . ALLERGIC RHINITIS 01/03/2007  . OSTEOARTHRITIS 01/03/2007  . SHOULDER PAIN, LEFT 02/18/2008  . OSTEOPOROSIS 01/03/2007  . NONSPECIFIC ABNORMAL ELECTROCARDIOGRAM 02/12/2007  . COLONIC POLYPS, HX OF 01/03/2007  . Diabetic retinopathy   . Smoker   . OA (osteoarthritis)    History   Social History  . Marital Status: Married    Spouse Name: N/A  . Number of Children: N/A  . Years of Education: N/A   Social History Main Topics  . Smoking status: Current Every Day Smoker -- 0.50 packs/day    Types: Cigarettes  . Smokeless tobacco: Not on file  . Alcohol Use: No  . Drug Use: No  . Sexual Activity: Not on file   Other Topics Concern  . None   Social History Narrative   Family History  Problem Relation Age of Onset  . Diabetes Sister   . Liver disease Daughter     Cirrhosis-transplant  . Colon cancer Neg Hx   . Breast cancer Maternal Aunt    Scheduled Meds: . ampicillin-sulbactam (UNASYN) IV  1.5 g Intravenous Q8H  . antiseptic oral rinse  15 mL Mouth Rinse 6 times per day  . arformoterol  15 mcg Nebulization BID  . budesonide (PULMICORT) nebulizer solution  0.5 mg Nebulization BID  . diltiazem  30 mg Oral BID  . enoxaparin (LOVENOX) injection  30 mg Subcutaneous Q24H  . furosemide  40 mg Oral BID  . insulin aspart  0-9 Units Subcutaneous 6 times per day  . predniSONE  40 mg Oral QAC breakfast   Continuous Infusions:  PRN Meds:.albuterol, morphine CONCENTRATE, ondansetron **OR** ondansetron (ZOFRAN) IV Medications Prior to Admission:   Prior to Admission medications   Medication Sig Start Date End Date Taking? Authorizing Provider  aspirin EC 81 MG tablet Take 81 mg by mouth daily.   Yes Historical Provider, MD  Fluticasone-Salmeterol (ADVAIR DISKUS) 100-50 MCG/DOSE AEPB Inhale 1 puff into the lungs 2 (two) times daily. 09/09/14  Yes Renato Shin, MD  furosemide (LASIX) 20 MG tablet Take 1 tablet (20 mg total) by mouth daily. 06/12/14  Yes Renato Shin, MD  IRON PO Take 1 tablet by mouth 2 (two) times daily.   Yes Historical Provider, MD  metFORMIN (GLUCOPHAGE) 1000 MG tablet TAKE 1 TABLET (1,000 MG TOTAL) BY MOUTH 2 (TWO) TIMES DAILY WITH A MEAL. 05/18/14  Yes Renato Shin, MD  repaglinide (PRANDIN) 0.5 MG tablet Take 0.5 mg by mouth 3 (three) times daily. 10/01/14  Yes Historical Provider, MD  ALPRAZolam Duanne Moron) 0.25 MG tablet 1/2-1 tab every 8 hrs as needed for anxiety or sleep Patient not taking: Reported on 10/14/2014 09/02/14   Renato Shin, MD  doxycycline (VIBRAMYCIN) 50 MG capsule Take 1 capsule (50 mg total) by mouth 2 (two) times daily. Patient not taking: Reported on 10/14/2014 09/09/14   Hilliard Clark  Loanne Drilling, MD  feeding supplement, ENSURE COMPLETE, (ENSURE COMPLETE) LIQD Take 237 mLs by mouth 2 (two) times daily between meals. Patient not taking: Reported on 10/14/2014 06/15/14   Eugenie Filler, MD   Allergies  Allergen Reactions  . Penicillins    CBC:    Component Value Date/Time   WBC 6.3 10/17/2014 0225   HGB 10.6* 10/17/2014 0225   HCT 34.5* 10/17/2014 0225   HCT 19.1* 06/12/2014 2314   PLT 159 10/17/2014 0225   MCV 95.0 10/17/2014 0225   NEUTROABS 5.5 10/17/2014 0225   LYMPHSABS 0.3* 10/17/2014 0225   MONOABS 0.5 10/17/2014 0225   EOSABS 0.0 10/17/2014 0225   BASOSABS 0.0 10/17/2014 0225   Comprehensive Metabolic Panel:    Component Value Date/Time   NA 145 10/17/2014 0225   K 4.7 10/17/2014 0225   CL 102 10/17/2014 0225   CO2 34* 10/17/2014 0225   BUN 28* 10/17/2014 0225   CREATININE 0.65 10/17/2014 0225    CREATININE 0.63 02/08/2012 1019   GLUCOSE 69 10/17/2014 0225   CALCIUM 8.3* 10/17/2014 0225   CALCIUM 9.0 01/03/2011 1134   AST 40 10/17/2014 0225   ALT 39 10/17/2014 0225   ALKPHOS 71 10/17/2014 0225   BILITOT 0.4 10/17/2014 0225   PROT 5.7* 10/17/2014 0225   ALBUMIN 3.2* 10/17/2014 0225    Physical Exam: Vital Signs: BP 141/65 mmHg  Pulse 95  Temp(Src) 98.2 F (36.8 C) (Oral)  Resp 32  Ht _0  (1.575 m)  Wt 51.438 kg (113 lb 6.4 oz)  BMI 20.74 kg/m2  SpO2 94% SpO2: SpO2: 94 % O2 Device: O2 Device: Nasal Cannula O2 Flow Rate: O2 Flow Rate (L/min): 3 L/min Intake/output summary:  Intake/Output Summary (Last 24 hours) at 10/17/14 1424 Last data filed at 10/17/14 1259  Gross per 24 hour  Intake    680 ml  Output   1870 ml  Net  -1190 ml   LBM:   Baseline Weight: Weight: 51.4 kg (113 lb 5.1 oz) Most recent weight: Weight: 51.438 kg (113 lb 6.4 oz)  Exam Findings:  General: Elderly frail female. Sitting in recliner eating Resp: Mild work of breathing observed at rest Psych: Tearful. Thought processes linear and goal directed         Palliative Performance Scale: 50%              Additional Data Reviewed: Recent Labs     10/16/14  0400  10/16/14  0934  10/17/14  0225  WBC  6.2   --   6.3  HGB  10.7*   --   10.6*  PLT  161   --   159  NA  142  145  145  BUN  31*  31*  28*  CREATININE  0.84  0.91  0.65     Time In: 1130 Time Out: 1240 Time Total: 70 min Greater than 50%  of this time was spent counseling and coordinating care related to the above assessment and plan. Staffed with Dr. Verlon Au. Will FU with pt and family tomorrow  Signed by: Dory Horn, NP  Dory Horn, NP  10/17/2014, 2:24 PM  Please contact Palliative Medicine Team phone at [phone] for questions and concerns.

## 2014-10-17 NOTE — Progress Notes (Signed)
RT Assessment: BIPAP pulled from room. Pt in no Distress. Resting at this time. Order PRN if needed. Vitals: HR: 103, RR: 22, Sats: 100% (2L). RT will monitor.

## 2014-10-17 NOTE — Progress Notes (Signed)
Recheck pt's CBG = 94 gave additional snack per protocol.   Will continue to monitor.  Saunders Revel T

## 2014-10-17 NOTE — Progress Notes (Signed)
Susan Browning:[id number] DOB: 24-Jun-1920 DOA: 10/13/2014 PCP: Renato Shin, MD  Brief narrative: 81 y/o ? DM ty II, htn, HLD, Combined Diastolic & Systolic HF, COPD [still smoking 2-3 cig a day] h/o esophageal stricture & Adenomatous polyp h/o in the past admitted c SOB-Was given albuterol and place don BI-pap-given 2 hour long nebs with IV solu-medrol. Felt on admission to be Resp> CHF as Smoking h/o-Placed on continued Bipap Ultimately felt Multifactorial Dyspnoea, more in keeping c decompnesated CHF > COPD Pulmonology consulted regarding care and options as seemed Bi-pap dependant during early hospital course GOC delineated and MOST form filled likely d/c with Hospice to follow at home  Past medical history-As per Problem list Chart reviewed as below- reviewed  Consultants:  CCM MD  Procedures:   BIpap  Antibiotics:  Unasyn 10/15/14   Subjective   A little anxious with the storms overnight No cp NO need for Bi-pap lastm NO cp Mildly SOB No fever   Objective    Interim History:   Telemetry: SInus tach, PVC, ? Afib   Objective: Filed Vitals:   10/17/14 0900 10/17/14 0927 10/17/14 1000 10/17/14 1259  BP: 153/107  171/107 141/65  Pulse: 96 96 99 95  Temp:    98.2 F (36.8 C)  TempSrc:    Oral  Resp: 14 26 17  32  Height:      Weight:      SpO2: 99% 97% 96% 94%    Intake/Output Summary (Last 24 hours) at 10/17/14 1604 Last data filed at 10/17/14 1259  Gross per 24 hour  Intake    530 ml  Output   1870 ml  Net  -1340 ml    Exam:  General: eomi, ncat0 Cardiovascular: s1 s 2 no m/r/g Respiratory:  Clear no added sound Abdomen: soft nt  . nd no rebound Skin no le edema Neuro intact  Data Reviewed: Basic Metabolic Panel:  Recent Labs Lab 10/14/14 0500 10/14/14 1940 10/15/14 0245 10/16/14 0400 10/16/14 0934 10/17/14 0225  NA  --  144 144 142 145 145  K  --  4.2 5.1 5.1 5.5* 4.7  CL  --  104 105 105 107 102  CO2  --  30 30 32 30  34*  GLUCOSE  --  99 153* 153* 279* 69  BUN  --  15 19 31* 31* 28*  CREATININE  --  0.62 0.80 0.84 0.91 0.65  CALCIUM  --  8.4* 8.1* 8.0* 8.4* 8.3*  MG 1.8 1.2*  --   --   --   --   PHOS  --   --  4.7* 4.3  --   --    Liver Function Tests:  Recent Labs Lab 10/13/14 2226 10/15/14 0245 10/16/14 0400 10/17/14 0225  AST 78*  --   --  40  ALT 38  --   --  39  ALKPHOS 100  --   --  71  BILITOT 0.7  --   --  0.4  PROT 6.7  --   --  5.7*  ALBUMIN 3.5 3.4* 3.3* 3.2*   No results for input(s): LIPASE, AMYLASE in the last 168 hours. No results for input(s): AMMONIA in the last 168 hours. CBC:  Recent Labs Lab 10/13/14 2226 10/14/14 0332 10/16/14 0400 10/17/14 0225  WBC 6.1 6.3 6.2 6.3  NEUTROABS  --   --  5.9 5.5  HGB 11.5* 10.0* 10.7* 10.6*  HCT 36.5 32.4* 34.7* 34.5*  MCV 92.9 92.0 95.1  95.0  PLT 177 164 161 159   Cardiac Enzymes: No results for input(s): CKTOTAL, CKMB, CKMBINDEX, TROPONINI in the last 168 hours. BNP: Invalid input(s): POCBNP CBG:  Recent Labs Lab 10/16/14 2034 10/16/14 2320 10/17/14 0339 10/17/14 0849 10/17/14 1258  GLUCAP 261* 114* 85 89 187*    Recent Results (from the past 240 hour(s))  MRSA PCR Screening     Status: None   Collection Time: 10/14/14  2:55 AM  Result Value Ref Range Status   MRSA by PCR NEGATIVE NEGATIVE Final    Comment:        The GeneXpert MRSA Assay (FDA approved for NASAL specimens only), is one component of a comprehensive MRSA colonization surveillance program. It is not intended to diagnose MRSA infection nor to guide or monitor treatment for MRSA infections.      Studies:              All Imaging reviewed and is as per above notation   Scheduled Meds: . ampicillin-sulbactam (UNASYN) IV  1.5 g Intravenous Q8H  . antiseptic oral rinse  15 mL Mouth Rinse 6 times per day  . arformoterol  15 mcg Nebulization BID  . budesonide (PULMICORT) nebulizer solution  0.5 mg Nebulization BID  . diltiazem  30 mg  Oral BID  . enoxaparin (LOVENOX) injection  30 mg Subcutaneous Q24H  . furosemide  40 mg Oral BID  . insulin aspart  0-9 Units Subcutaneous 6 times per day  . predniSONE  40 mg Oral QAC breakfast   Continuous Infusions:    Assessment/Plan:  Principal Problem:   Acute respiratory failure with hypoxia and hypercarbia Active Problems:   Diabetes   Iron deficiency anemia   Essential hypertension   Memory loss   Dyspnea   Symptomatic anemia   Chronic diastolic CHF (congestive heart failure)   COPD exacerbation   Respiratory failure with hypoxia and hypercapnia   Acute exacerbation of COPD with asthma   Acute on chronic diastolic heart failure   Palliative care encounter    Multifactorial Acute respiratory failure with hypoxia and hypercarbia-  Decompensated over the course of 7/6-10/15/14  CCM Consulted-agree with initial recommendations- poor likely candidate for aggressive ACLS which would be barbaric to her.  I have discussed my findings with family  She DOES NOT WISH FURTHER BI-PAP--We will need to discuss these implaictions with her relatives.  She is also not sure if she would want to be re-hospitalized and we should try to clarify this prior to d/c home c Hopssice  Significant JVD 10 cm still cm  See below Potentital Aspiration PNA  RLL opacities persist  Started UNascy and dc Azithro 7/7  Narrowed to PO Augmentin 7/10  Rpt CXR 7/10 am Acute on chronic diastolic heart failure-Coponent ? Takotsubo CM 2/2 to death f her husband about 1 month ago prior after 77 years of marriage  Last ECHO EF 45-50%, PA Peak 46 mm hg, Mild AoS  Will need Diuresis-HOme dose lasix= 20 ?switched to Lasix IV 40--now 0 po bid 10/16/14  Still needs diuresis  I/o = -2.2 cummulatively  Daily weights   HH low salt diet Acute exacerbation of COPD with current Tobacco Use  Unlikely to quit smoking   Solu-medrol short term Taper rapidly to prednisone 60-->20 mg 7/9 after 2 days IV  steroids Lactic Acidosis  unclear if ty "A" vs ty "B"  Pro calcitonin was neg]  lactic acidosis cleared on 7/6  Rpt 2 Vw CXR 10/15/14 shows RLL  PNA-   speech eval was neg for any concern for aspiration--Am still Rx for PNA from aspiration source  Low dose CT as OP r/o Cancer [heavy smoker h/o]  Iron deficiency anemia  Hb 10 and stable   Family Communication: discussed in detail 10/15/14 DNR once D/c [place] discussions per pallaitive Disposition Plan:  Inpatient-likely d/c 24 hours to home c Hospice   Verneita Griffes, MD  [facility] Pager [phone] 10/17/2014, 4:04 PM    LOS: 3 days

## 2014-10-18 ENCOUNTER — Inpatient Hospital Stay (HOSPITAL_COMMUNITY): Payer: Medicare Other

## 2014-10-18 LAB — GLUCOSE, CAPILLARY
GLUCOSE-CAPILLARY: 158 mg/dL — AB (ref 65–99)
Glucose-Capillary: 141 mg/dL — ABNORMAL HIGH (ref 65–99)
Glucose-Capillary: 108 mg/dL — ABNORMAL HIGH (ref 65–99)

## 2014-10-18 MED ORDER — BUDESONIDE 0.5 MG/2ML IN SUSP: 0.5000 mg | Freq: Two times a day (BID) | RESPIRATORY_TRACT | Status: DC

## 2014-10-18 MED ORDER — AMOXICILLIN-POT CLAVULANATE ER 1000-62.5 MG PO TB12: 2.0000 | ORAL_TABLET | Freq: Two times a day (BID) | ORAL | Status: DC

## 2014-10-18 MED ORDER — MORPHINE SULFATE (CONCENTRATE) 10 MG/0.5ML PO SOLN: 5.0000 mg | ORAL | Status: AC | PRN

## 2014-10-18 MED ORDER — ARFORMOTEROL TARTRATE 15 MCG/2ML IN NEBU: 15.0000 ug | INHALATION_SOLUTION | Freq: Two times a day (BID) | RESPIRATORY_TRACT | Status: DC

## 2014-10-18 NOTE — Progress Notes (Signed)
Contacted case management upon pt's arrival to floor and notified of home hospice plan for discharge.

## 2014-10-18 NOTE — Progress Notes (Signed)
Susan Browning WNI:[id number] DOB: 10-01-1920 DOA: 10/13/2014 PCP: Renato Shin, MD  Brief narrative: 81 y/o ? DM ty II, htn, HLD, Combined Diastolic & Systolic HF, COPD [still smoking 2-3 cig a day] h/o esophageal stricture & Adenomatous polyp h/o in the past admitted c SOB-Was given albuterol and place don BI-pap-given 2 hour long nebs with IV solu-medrol. Felt on admission to be Resp> CHF as Smoking h/o-Placed on continued Bipap Ultimately felt Multifactorial Dyspnoea, more in keeping c decompnesated CHF > COPD Pulmonology consulted regarding care and options as seemed Bi-pap dependant during early hospital course GOC delineated and MOST form filled likely d/c with Hospice to follow at home  Past medical history-As per Problem list Chart reviewed as below- reviewed  Consultants:  CCM MD  Procedures:   BIpap  Antibiotics:  Unasyn 10/15/14   Subjective   Noted hypoglycemia ON No n/v/cp A little "short-winded" today Sl;ighlty tired compared to yesterday and a little confused Anxious and tells me that's he wants to go home   Objective    Interim History:   Telemetry: SInus tach, PVC, ? Afib   Objective: Filed Vitals:   10/18/14 0800 10/18/14 0944 10/18/14 0950 10/18/14 1152  BP: 165/78   139/87  Pulse:      Temp: 98 F (36.7 C)   97.9 F (36.6 C)  TempSrc: Oral   Oral  Resp: 20   26  Height:      Weight:      SpO2: 93% 100% 98% 94%    Intake/Output Summary (Last 24 hours) at 10/18/14 1236 Last data filed at 10/18/14 1218  Gross per 24 hour  Intake   1150 ml  Output   2475 ml  Net  -1325 ml    Exam:  General: eomi, ncat Cardiovascular: s1 s 2 no m/r/g Respiratory:  Clear no added sound Abdomen: soft nt  . nd no rebound Skin no le edema Neuro intact  Data Reviewed: Basic Metabolic Panel:  Recent Labs Lab 10/14/14 0500 10/14/14 1940 10/15/14 0245 10/16/14 0400 10/16/14 0934 10/17/14 0225  NA  --  144 144 142 145 145  K  --  4.2 5.1  5.1 5.5* 4.7  CL  --  104 105 105 107 102  CO2  --  30 30 32 30 34*  GLUCOSE  --  99 153* 153* 279* 69  BUN  --  15 19 31* 31* 28*  CREATININE  --  0.62 0.80 0.84 0.91 0.65  CALCIUM  --  8.4* 8.1* 8.0* 8.4* 8.3*  MG 1.8 1.2*  --   --   --   --   PHOS  --   --  4.7* 4.3  --   --    Liver Function Tests:  Recent Labs Lab 10/13/14 2226 10/15/14 0245 10/16/14 0400 10/17/14 0225  AST 78*  --   --  40  ALT 38  --   --  39  ALKPHOS 100  --   --  71  BILITOT 0.7  --   --  0.4  PROT 6.7  --   --  5.7*  ALBUMIN 3.5 3.4* 3.3* 3.2*   No results for input(s): LIPASE, AMYLASE in the last 168 hours. No results for input(s): AMMONIA in the last 168 hours. CBC:  Recent Labs Lab 10/13/14 2226 10/14/14 0332 10/16/14 0400 10/17/14 0225  WBC 6.1 6.3 6.2 6.3  NEUTROABS  --   --  5.9 5.5  HGB 11.5* 10.0* 10.7* 10.6*  HCT 36.5 32.4* 34.7* 34.5*  MCV 92.9 92.0 95.1 95.0  PLT 177 164 161 159   Cardiac Enzymes: No results for input(s): CKTOTAL, CKMB, CKMBINDEX, TROPONINI in the last 168 hours. BNP: Invalid input(s): POCBNP CBG:  Recent Labs Lab 10/17/14 2314 10/17/14 2342 10/18/14 0343 10/18/14 0811 10/18/14 1151  GLUCAP 48* 94 141* 108* 158*    Recent Results (from the past 240 hour(s))  MRSA PCR Screening     Status: None   Collection Time: 10/14/14  2:55 AM  Result Value Ref Range Status   MRSA by PCR NEGATIVE NEGATIVE Final    Comment:        The GeneXpert MRSA Assay (FDA approved for NASAL specimens only), is one component of a comprehensive MRSA colonization surveillance program. It is not intended to diagnose MRSA infection nor to guide or monitor treatment for MRSA infections.      Studies:              All Imaging reviewed and is as per above notation   Scheduled Meds: . amoxicillin-clavulanate  2 tablet Oral Q12H  . antiseptic oral rinse  15 mL Mouth Rinse 6 times per day  . arformoterol  15 mcg Nebulization BID  . budesonide (PULMICORT) nebulizer  solution  0.5 mg Nebulization BID  . diltiazem  30 mg Oral BID  . enoxaparin (LOVENOX) injection  30 mg Subcutaneous Q24H  . furosemide  40 mg Oral BID  . predniSONE  20 mg Oral QAC breakfast   Continuous Infusions:    Assessment/Plan:  Principal Problem:   Acute respiratory failure with hypoxia and hypercarbia Active Problems:   Diabetes   Iron deficiency anemia   Essential hypertension   Memory loss   Dyspnea   Symptomatic anemia   Chronic diastolic CHF (congestive heart failure)   COPD exacerbation   Respiratory failure with hypoxia and hypercapnia   Acute exacerbation of COPD with asthma   Acute on chronic diastolic heart failure   Palliative care encounter    Multifactorial Acute respiratory failure with hypoxia and hypercarbia-  Decompensated over the course of 7/6-10/15/14  CCM Consulted-agree with initial recommendations- poor likely candidate for aggressive ACLS which would be barbaric to her.  I have discussed my findings with family  She DOES NOT WISH FURTHER BI-PAP--appreciate PM discussing implications with her relatives.    She is also not sure if she would want to be re-hospitalized and hopsice should realistically address this once under theuircare  Significant JVD 10 cm still cm  See below Potentital Aspiration PNA  RLL opacities persist  Started UNascy and dc Azithro 7/7  Narrowed to PO Augmentin 7/10-->complete course 10/28/14 as CXR on 7/10 shows now opacities Acute on chronic diastolic heart failure-Coponent ? Takotsubo CM 2/2 to death f her husband about 1 month ago prior after 68 years of marriage  Last ECHO EF 45-50%, PA Peak 46 mm hg, Mild AoS  Will need Diuresis-HOme dose lasix= 20 ?switched to Lasix IV 40--now  po bid 10/16/14  Still needs diuresis  I/o = -3.3 cumuatively  Diurese to comfort  liberalize diet  Consider low dose ciatlopram for patholologic grieveing should she be depressed for a prolonged period of time Acute  exacerbation of COPD with current Tobacco Use  Unlikely to quit smoking   Solu-medrol short term Taper rapidly to prednisone 60-->20 mg 7/9 after 2 days IV steroids Lactic Acidosis  unclear if ty "A" vs ty "B"  Pro calcitonin was neg]  lactic acidosis  cleared on 7/6  Rpt 2 Vw CXR 10/15/14 shows RLL PNA-   speech eval was neg for any concern for aspiration--Am still Rx for PNA from aspiration source  Low dose CT as OP r/o Cancer [heavy smoker h/o]  Iron deficiency anemia  Hb 10 and stable   Family Communication: discussed in detail 10/15/14 DNR once D/c Disposition Plan:  Inpatient-likely d/c 24 hours to home c Hospice Family has had long conversations with Palliative and Myself and Home with hospice in next 24 hours once choice of Hospice offered. We will transfer out of SDU and start comfort measures Prognosis is weeks maybe I personally updated her Susan Browning on telephone   Verneita Griffes, MD  [facility] Pager [phone] 10/18/2014, 12:36 PM    LOS: 4 days

## 2014-10-18 NOTE — Progress Notes (Signed)
Daily Progress Note   Patient Name: Susan Browning       Date: 10/18/2014 DOB: 1920/10/10  Age: 81 y.o. MRN#: [id number] Attending Physician: Nita Sells, MD Primary Care Physician: Renato Shin, MD Admit Date: 10/13/2014  Reason for Consultation/Follow-up: Establishing goals of care and Psychosocial/spiritual support  Subjective:  She reports feeling better today. No BiPAP last night. Alert, eating breakfast. Per Dr. Arlyss Queen note, pt indicated that she may not want BiPAP or further hospitalization in the future. Discussed this with pt's daughter [name] as well as [name]. Family shares that they feel their mother does not have much time left. They state they anticipate that their mother  when the day comes when she begins to have another exacerbation, she will tell them that she is tired and ready to be with her husband. They state today that they would not want to see their mother have an ICU stay such as they witnessed with their father and that they will honor her wishes for no further treatment. MOST form in the chart, referencing DNR. If pt did want to return to hospital , family would honor this as well and also be willing to trial of BiPAP. No CPR, or intubation Interval Events: Pt had a good night. No BiPap Length of Stay: 4 days  Current Medications: Scheduled Meds:  . amoxicillin-clavulanate  2 tablet Oral Q12H  . antiseptic oral rinse  15 mL Mouth Rinse 6 times per day  . arformoterol  15 mcg Nebulization BID  . budesonide (PULMICORT) nebulizer solution  0.5 mg Nebulization BID  . diltiazem  30 mg Oral BID  . enoxaparin (LOVENOX) injection  30 mg Subcutaneous Q24H  . furosemide  40 mg Oral BID  . predniSONE  20 mg Oral QAC breakfast    Continuous Infusions:    PRN Meds: albuterol, morphine CONCENTRATE, ondansetron **OR** ondansetron (ZOFRAN) IV  Palliative Performance Scale: 40-50%%     Vital Signs: BP 141/74 mmHg  Pulse 92  Temp(Src) 97.9 F (36.6  C) (Oral)  Resp 24  Ht 5\' 2"  (1.575 m)  Wt 49.2 kg (108 lb 7.5 oz)  BMI 19.83 kg/m2  SpO2 94% SpO2: SpO2: 94 % O2 Device: O2 Device: Nasal Cannula O2 Flow Rate: O2 Flow Rate (L/min): 3 L/min  Intake/output summary:  Intake/Output Summary (Last 24 hours) at 10/18/14 1702 Last data filed at 10/18/14 1400  Gross per 24 hour  Intake   1220 ml  Output   2475 ml  Net  -1255 ml   LBM:   Baseline Weight: Weight: 51.4 kg (113 lb 5.1 oz) Most recent weight: Weight: 49.2 kg (108 lb 7.5 oz)  Physical Exam: General: Elderly frail female. Alert. No acute distress. Sitting in recliner having breakfast Resp: No work of breathing Cardiac; RRR              Additional Data Reviewed: Recent Labs     10/16/14  0400  10/16/14  0934  10/17/14  0225  WBC  6.2   --   6.3  HGB  10.7*   --   10.6*  PLT  161   --   159  NA  142  145  145  BUN  31*  31*  28*  CREATININE  0.84  0.91  0.65     Problem List:  Patient Active Problem List   Diagnosis Date Noted  . Palliative care encounter   . COPD exacerbation 10/14/2014  . COPD with acute exacerbation 10/14/2014  .  Acute respiratory failure with hypoxia and hypercarbia 10/14/2014  . Respiratory failure with hypoxia and hypercapnia 10/14/2014  . Acute exacerbation of COPD with asthma 10/14/2014  . Acute on chronic diastolic heart failure 09/81/1914  . Lactic acidosis   . Arthritis of left shoulder region 09/22/2014  . Acute on chronic combined systolic and diastolic heart failure 78/29/5621  . Malnutrition of moderate degree 06/14/2014  . Dyspnea 06/12/2014  . Anemia 06/12/2014  . Symptomatic anemia 06/12/2014  . SOB (shortness of breath) 06/12/2014  . Chronic diastolic CHF (congestive heart failure) 06/12/2014  . Malnutrition 06/12/2014  . Wellness examination 04/28/2014  . Memory loss 04/28/2014  . Encounter for long-term (current) use of other medications 01/03/2011  . Epigastric pain 08/18/2010  . NAUSEA 05/18/2010  .  HYPERCHOLESTEROLEMIA 06/01/2009  . LEUKOPENIA, CHRONIC 06/01/2009  . SHOULDER PAIN, LEFT 02/18/2008  . Iron deficiency anemia 08/15/2007  . NONSPECIFIC ABNORMAL ELECTROCARDIOGRAM 02/12/2007  . Diabetes 01/03/2007  . Essential hypertension 01/03/2007  . ALLERGIC RHINITIS 01/03/2007  . OSTEOARTHRITIS 01/03/2007  . OSTEOPOROSIS 01/03/2007  . COLONIC POLYPS, HX OF 01/03/2007     Palliative Care Assessment & Plan    Code Status:  DNR in community setting. MOST form on the chart which will need to go with pt when discharged  Goals of Care:  Home with hospice in home care. Family to follow pt lead once involved with hospice as to further hospitalization and interventions.   Desire for further Chaplaincy support:no  3. Symptom Management:  Dyspnea: Continue ms04 prn  5. Prognosis: < 6 months  5. Discharge Planning: Home with Hospice   Care plan was discussed with Dr. Verlon Au  Thank you for allowing the Palliative Medicine Team to assist in the care of this patient.   Time In: *0800 Time Out: 0830 Total Time 51min Prolonged Time Billed  no     Greater than 50%  of this time was spent counseling and coordinating care related to the above assessment and plan.   Dory Horn, NP  10/18/2014, 5:02 PM  Please contact Palliative Medicine Team phone at [phone] for questions and concerns.

## 2014-10-18 NOTE — Care Management Note (Signed)
Case Management Note  Patient Details  Name: Susan Browning MRN: [id number] Date of Birth: 1920/05/26  Subjective/Objective:                  SOB  Action/Plan: CM received call from [place], Staff Nurse for patient. Per conversation with RN CM, Lucia Bitter, who spoke to [facility] with hospice on 10/17/14, there was not anyone available to admit to hospice on the weekend. CM outreach to staff nurse Mickel Baas. CM informed Mickel Baas of situation with Hospice staffing and that CM would address tomorrow.  Please outreach to CM for ongoing D/C planning needs.    Expected Discharge Date:                  Expected Discharge Plan:  Home w Hospice Care  In-House Referral:     Discharge planning Services  CM Consult  Post Acute Care Choice:    Choice offered to:     DME Arranged:    DME Agency:     HH Arranged:    [place] Agency:     Status of Service:  In process, will continue to follow  Medicare Important Message Given: Date Medicare IM Given:  10/16/14 Medicare IM give by:    Date Additional Medicare IM Given:    Additional Medicare Important Message give by:     If discussed at [place] of Stay Meetings, dates discussed:    Additional Comments:  Guido Sander, RN 10/18/2014, 5:49 PM

## 2014-10-19 ENCOUNTER — Telehealth: Payer: Self-pay | Admitting: Endocrinology

## 2014-10-19 LAB — GLUCOSE, CAPILLARY
Glucose-Capillary: 144 mg/dL — ABNORMAL HIGH (ref 65–99)
Glucose-Capillary: 176 mg/dL — ABNORMAL HIGH (ref 65–99)

## 2014-10-19 NOTE — Progress Notes (Signed)
Notified by Jari Favre of family request for Hospice and [facility] services at home after discharge. Chart and patient Information currently under review to confirm hospice eligibility.  Spoke with [name], via phone conversation to initiate education related to hospice philosophy, services and team approach to care. Family verbalized understanding of the information provided. Per discussion plan is for discharge to home by [facility] today.  Patient will need prescriptions for discharge comfort medications.  DME needs discussed and family requested light weigh W/C, tub seat with back, and O2.  [facility] equipment manager Jewel Ysidro Evert notified and will contact [place] to arrange delivery to the home. The home address has been verified and is correct in the chart; [name] is family member to be contacted to arrange time of delivery.  [facility] aware of the above.  Completed discharge summary will need to be faxed to [facility] at [phone] when final. Please notify [facility] when patient is ready to leave unit at discharge-call [phone].  [facility] information and contact numbers have been given to [facility].  Above information shared with [facility]. Please call with any questions.  Annia Belt RN, [facility] Liaison [phone]

## 2014-10-19 NOTE — Telephone Encounter (Signed)
See note below and please advise, Thanks! 

## 2014-10-19 NOTE — Telephone Encounter (Signed)
Hospice is calling # [phone]  Please call to let them know if you will be the attending after she is out for hospice

## 2014-10-19 NOTE — Progress Notes (Signed)
Physical Therapy Treatment Patient Details Name: Susan Browning MRN: [id number] DOB: 03/21/1921 Today's Date: 10/19/2014    History of Present Illness 81 yo female h/o copd, dm, htn, combined chf, comes in with sob that occurred 7/5.     PT Comments    Pt progressing well towards physical therapy goals. Was on RA when PT entered ([place] shifted off her nose) and sats at 83%. [place] again (3L/min supplemental O2) and sats improved to 91%. After ambulation sats had dropped to 87-88%. RN present and notified. Recommended continued use of the RW at home for balance, safety and energy conservation. Pt and son would like to have a shower chair/tub bench at d/c if able to get with insurance.   Follow Up Recommendations  Home health PT;Supervision for mobility/OOB     Equipment Recommendations  3in1 (PT) (or tub bench if able)    Recommendations for Other Services       Precautions / Restrictions Precautions Precautions: Fall Restrictions Weight Bearing Restrictions: No    Mobility  Bed Mobility Overal bed mobility: Modified Independent             General bed mobility comments: Use of rail and increased time for pt to complete transition to EOB. VC's for continued scooting until feet were on the floor.   Transfers Overall transfer level: Needs assistance Equipment used: Rolling walker (2 wheeled) Transfers: Sit to/from Stand Sit to Stand: Min guard         General transfer comment: Pt demonstrated proper hand placement and safety awareness during transfers. Hands-on assist provided for balance as pt appeared unsteady while reaching from the bed to the walker for support.   Ambulation/Gait Ambulation/Gait assistance: Min assist Ambulation Distance (Feet): 200 Feet Assistive device: Rolling walker (2 wheeled) Gait Pattern/deviations: Step-through pattern;Decreased stride length;Trunk flexed Gait velocity: Decreased Gait velocity interpretation: Below normal speed for  age/gender General Gait Details: Recommended use of RW for support and energy conservation. Pt was able to ambulate ~200 feet with occasional min assist for LOB. ~4 minor losses of balance when the walker got too far ahead of her, and 1 major LOB when pt stepped on her other foot and tripped. Min assist provided to recover.    Stairs            Wheelchair Mobility    Modified Rankin (Stroke Patients Only)       Balance Overall balance assessment: Needs assistance Sitting-balance support: Feet supported;No upper extremity supported Sitting balance-Leahy Scale: Good     Standing balance support: No upper extremity supported;During functional activity Standing balance-Leahy Scale: Poor Standing balance comment: Pt requires UE support and/or therapist assist for dynamic standing activity.                     Cognition Arousal/Alertness: Awake/alert Behavior During Therapy: WFL for tasks assessed/performed (Very pleasant) Overall Cognitive Status: Within Functional Limits for tasks assessed                      Exercises      General Comments        Pertinent Vitals/Pain Pain Assessment: No/denies pain    Home Living                      Prior Function            PT Goals (current goals can now be found in the care plan section) Acute Rehab PT Goals  Patient Stated Goal: return home with family PT Goal Formulation: With patient/family Time For Goal Achievement: 10/30/14 Potential to Achieve Goals: Good Progress towards PT goals: Progressing toward goals    Frequency  Min 3X/week    PT Plan Current plan remains appropriate    Co-evaluation             End of Session Equipment Utilized During Treatment: Gait belt Activity Tolerance: Patient tolerated treatment well Patient left: in chair;with call bell/phone within reach;with family/visitor present;with nursing/sitter in room     Time: 0802-0824 PT Time Calculation (min)  (ACUTE ONLY): 22 min  Charges:  $Gait Training: 8-22 mins                    G Codes:      Rolinda Roan November 05, 2014, 9:26 AM   Rolinda Roan, PT, DPT Acute Rehabilitation Services Pager: [phone]

## 2014-10-19 NOTE — Care Management Note (Addendum)
Case Management Note  Patient Details  Name: Susan Browning MRN: [id number] Date of Birth: [birth year]  Subjective/Objective:   Pt admitted with acute respiratory failure                 Action/Plan: PTA pt lived at home with family.   Expected Discharge Date:       10/19/14           Expected Discharge Plan:  Home w Hospice Care  In-House Referral:     Discharge planning Services  CM Consult  Post Acute Care Choice:  Hospice Choice offered to:  Adult Children  DME Arranged:    DME Agency:     HH Arranged:  Disease Management [place] Agency:  [facility]  Status of Service:  Completed, signed off  Medicare Important Message Given:  Yes-third notification given Date Medicare IM Given:    Medicare IM give by:    Date Additional Medicare IM Given:    Additional Medicare Important Message give by:     If discussed at [place] of Stay Meetings, dates discussed:    Additional Comments:  10/19/14- f/u made regarding order for Home Hospice call made to [facility] to see if they had referral from weekend- spoke with [name]- no referral was found on pt- as no documentation was found as to which agency the referral was made to- call made to family and f/u made with [name] pt's daughter [phone]) regarding order to offer Home Hospice choice- per conversation with [facility] they would like to use [facility] for home hospice services- discussed DME needs- they would like a shower bench, hospital bed, and pt will need home 02- per daughter they would also need transportation home. Referral called to [facility] for home hospice with a plan for discharge today- DME needs given to referral/intake to f/u with family on. MD made aware of family choice for Hospice. CSW notified of transportation needs for later today.   [name], [phone] 10/19/2014, 11:00 AM

## 2014-10-19 NOTE — Progress Notes (Signed)
CSW consulted for ambulance transportation home  Patient will discharge to home Anticipated discharge date:10/19/14 Transportation by [facility]-scheduled for 1pm  CSW signing off.  Domenica Reamer, [place] Social Worker [phone]

## 2014-10-19 NOTE — Discharge Summary (Signed)
Physician Discharge Summary  Susan Browning PXT:[id number] DOB: 11-30-1920 DOA: 10/13/2014  PCP: Renato Shin, MD  Admit date: 10/13/2014 Discharge date: 10/19/2014  Time spent: 40 minutes  Recommendations for Outpatient Follow-up:  1. Recommend delineation of goals of care moving forward-she actually has made some modest recovery and may not need long-term hospice if she does as well as she looked on the day of discharge 2. Consider lab work if felt necessary 3. Continue Lasix at the low doses to comfort 4. Recommend Roxanol by mouth for increased work of breathing/dyspnea, also will need home oxygen-daughter is aware 5. Patient will be discharged home with hospice she has a walker already and will need 3 in 1  6. Consider citalopram versus BuSpar for pathological grieving if felt indicated by primary care physician 7. Dr. Loanne Drilling has agreed to be her hospice provider   Discharge Diagnoses:  Principal Problem:   Acute respiratory failure with hypoxia and hypercarbia Active Problems:   Diabetes   Iron deficiency anemia   Essential hypertension   Memory loss   Dyspnea   Symptomatic anemia   Chronic diastolic CHF (congestive heart failure)   COPD exacerbation   Respiratory failure with hypoxia and hypercapnia   Acute exacerbation of COPD with asthma   Acute on chronic diastolic heart failure   Palliative care encounter   Discharge Condition: Overall guarded however has made some improvements  Diet recommendation: Liberalized to comfort feeds  Filed Weights   10/16/14 0300 10/17/14 0300 10/18/14 0344  Weight: 51.075 kg (112 lb 9.6 oz) 51.438 kg (113 lb 6.4 oz) 49.2 kg (108 lb 7.5 oz)    History of present illness:   Brief narrative: 81 y/o ? DM ty II, htn, HLD, Combined Diastolic & Systolic HF, COPD [still smoking 2-3 cig a day] h/o esophageal stricture & Adenomatous polyp h/o in the past admitted c SOB-Was given albuterol and place don BI-pap-given 2 hour long nebs with IV  solu-medrol. Felt on admission to be Resp> CHF as Smoking h/o-Placed on continued Bipap Ultimately felt Multifactorial Dyspnoea, more in keeping c decompnesated CHF > COPD Pulmonology consulted regarding care and options as seemed Bi-pap dependant during early hospital course [place] delineated and MOST form filled likely d/c with Hospice to follow at home  Hospital Course:  Multifactorial Acute respiratory failure with hypoxia and hypercarbia-  Decompensated over the course of 7/6-10/15/14  CCM Consulted-agree with initial recommendations- poor likely candidate for aggressive ACLS which would be barbaric to her. I have discussed my findings with family  She DOES NOT WISH FURTHER BI-PAP--appreciate PM discussing implications with her relatives.   She is also not sure if she would want to be re-hospitalized and hopsice should realistically address this once under theuircare  Significant JVD about 5 cm on discharge home still cm Consider workup given palliative performance score as per primary care physician and discussion about trajectory of care-she seems to made some modest recovery however overall I think her prognosis may still be guarded Potentital Aspiration PNA  RLL opacities persist  Started UNascy and dc Azithro 7/7  Narrowed to PO Augmentin 7/10-->complete course 10/28/14 as CXR on 7/10 shows now opacities Acute on chronic diastolic heart failure-Coponent ? Takotsubo CM 2/2 to death f her husband about 1 month ago prior after 7 years of marriage  Last ECHO EF 45-50%, PA Peak 46 mm hg, Mild AoS  Will need Diuresis-HOme dose lasix= 20 ?switched to Lasix IV 40--now po bid 10/16/14  I/o = -3.3 cumuatively however  he stopped checking on 7/10  Diurese to comfort  liberalize diet  Consider low dose ciatlopram for patholologic grieveing should she be depressed for a prolonged period of time Acute exacerbation of COPD with current Tobacco Use  Unlikely to quit  smoking  Solu-medrol short term Taper rapidly to prednisone 60-->20 mg 7/9 after 2 days IV steroids Lactic Acidosis  unclear if ty "A" vs ty "B"  Pro calcitonin was neg  lactic acidosis cleared on 7/6  Rpt 2 Vw CXR 10/15/14 shows RLL PNA-repeat chest x-ray 7/10 also confirmed this so we are treating her with antibiotics  speech eval was neg for any concern for aspiration  Iron deficiency anemia  Hb 10 and stable  I have recommended to discontinue oral iron supplementation and multivitamins  Consultants:  CCM MD  Palliative medicine  Procedures:  BIpap  Antibiotics:  Unasyn 10/15/14  Augmentin to complete on 7/20   Discharge Exam: Filed Vitals:   10/19/14 0544  BP: 148/79  Pulse: 101  Temp: 99 F (37.2 C)  Resp:     General: Looks the best that I've seen her. Not on oxygen but desatted with ambulation and hence will need home O2 Cardiovascular: S1-S2 no murmur rub or gallop, JVD 5 cm, no bruit tolerating diet Respiratory: Clinically clear  Discharge Instructions   Discharge Instructions    Diet - low sodium heart healthy    Complete by:  As directed      Increase activity slowly    Complete by:  As directed           Current Discharge Medication List    START taking these medications   Details  amoxicillin-clavulanate (AUGMENTIN XR) 1000-62.5 MG per tablet Take 2 tablets by mouth every 12 (twelve) hours. Qty: 20 tablet, Refills: 0    arformoterol (BROVANA) 15 MCG/2ML NEBU Take 2 mLs (15 mcg total) by nebulization 2 (two) times daily. Qty: 120 mL, Refills: 0    budesonide (PULMICORT) 0.5 MG/2ML nebulizer solution Take 2 mLs (0.5 mg total) by nebulization 2 (two) times daily. Qty: 2 mL, Refills: 12    Morphine Sulfate (MORPHINE CONCENTRATE) 10 MG/0.5ML SOLN concentrated solution Take 0.25 mLs (5 mg total) by mouth every 4 (four) hours as needed for shortness of breath. Qty: 42 mL, Refills: 0      CONTINUE these medications which have NOT  CHANGED   Details  aspirin EC 81 MG tablet Take 81 mg by mouth daily.    Fluticasone-Salmeterol (ADVAIR DISKUS) 100-50 MCG/DOSE AEPB Inhale 1 puff into the lungs 2 (two) times daily. Qty: 1 each, Refills: 5    furosemide (LASIX) 20 MG tablet Take 1 tablet (20 mg total) by mouth daily. Qty: 30 tablet, Refills: 3    metFORMIN (GLUCOPHAGE) 1000 MG tablet TAKE 1 TABLET (1,000 MG TOTAL) BY MOUTH 2 (TWO) TIMES DAILY WITH A MEAL. Qty: 180 tablet, Refills: 1    repaglinide (PRANDIN) 0.5 MG tablet Take 0.5 mg by mouth 3 (three) times daily. Refills: 11    ALPRAZolam (XANAX) 0.25 MG tablet 1/2-1 tab every 8 hrs as needed for anxiety or sleep Qty: 20 tablet, Refills: 0      STOP taking these medications     IRON PO      doxycycline (VIBRAMYCIN) 50 MG capsule      feeding supplement, ENSURE COMPLETE, (ENSURE COMPLETE) LIQD        Allergies  Allergen Reactions  . Penicillins    Follow-up Information    Follow up  with Hospice at [facility].   Specialty:  Hospice and Palliative Medicine   Why:  Home Hospice arranged   Contact information:   [place] [id number] [phone]        The results of significant diagnostics from this hospitalization (including imaging, microbiology, ancillary and laboratory) are listed below for reference.    Significant Diagnostic Studies: Dg Chest 2 View  10/18/2014   CLINICAL DATA:  81 year old female with pneumonia.  EXAM: CHEST  2 VIEW  COMPARISON:  10/15/2014 and prior studies  FINDINGS: Marked enlargement of the cardiopericardial silhouette is noted.  New right lower lobe consolidation and small right pleural effusion noted. Right middle lobe atelectasis/ airspace disease again noted.  Mild interstitial opacities are identified and mild interstitial edema is not excluded.  Left basilar atelectasis is unchanged.  There is no evidence of pneumothorax.  IMPRESSION: New right lower lobe consolidation/ pneumonia and small right  pleural effusion.  Mild interstitial opacities -mild interstitial edema not excluded.  Unchanged right middle lobe atelectasis/ airspace disease, left basilar atelectasis and marked enlargement of the cardiopericardial silhouette.   Electronically Signed   By: Margarette Canada M.D.   On: 10/18/2014 10:00   Dg Chest 2 View  10/14/2014   CLINICAL DATA:  Episode of shortness of breath last evening, currently clinically improved  EXAM: CHEST  2 VIEW  COMPARISON:  Portable chest x-ray of October 13, 2014  FINDINGS: The cardiopericardial silhouette remains enlarged. The central pulmonary vascularity is mildly engorged and slightly more conspicuous today. The pulmonary interstitial markings are increased bilaterally as compared to the previous study. There remain areas of patchy increased density in the right infrahilar region. There is minimal blunting of the posterior costophrenic angles bilaterally. There are degenerative changes of the left glenohumeral joint. The thoracic spine is not well evaluated on the lateral view. No high-grade compression is observed.  IMPRESSION: COPD with low-grade CHF. When compared to the previous study the pulmonary vascularity and interstitial markings are slightly more conspicuous.   Electronically Signed   By: David  Martinique M.D.   On: 10/14/2014 08:20   Dg Chest Port 1 View  10/15/2014   CLINICAL DATA:  Follow-up of respiratory failure  EXAM: PORTABLE CHEST - 1 VIEW  COMPARISON:  PA and lateral chest of October 14, 2014  FINDINGS: The lungs are well-expanded. The interstitial markings are mildly increased but slightly less conspicuous today. The airspace opacity in the right lower lobe is slightly less conspicuous. There is stable patchy density in the left mid lung. There is no pleural effusion or pneumothorax. The cardiac silhouette remains enlarged. The pulmonary vascularity is prominent centrally but there is no cephalization of the vascular pattern. There is degenerative change of the left  shoulder.  IMPRESSION: Slight interval improvement in the appearance of the airspace opacity in the right lower lobe. There is stable cardiomegaly without significant pulmonary vascular engorgement.   Electronically Signed   By: David  Martinique M.D.   On: 10/15/2014 07:41   Dg Chest Port 1 View  10/13/2014   CLINICAL DATA:  Shortness of Breath  EXAM: PORTABLE CHEST - 1 VIEW  COMPARISON:  06/12/2014  FINDINGS: Cardiomegaly. There is streaky right infrahilar atelectasis or infiltrate. No pulmonary edema. Extensive degenerative changes left shoulder.  IMPRESSION: No pulmonary edema. Streaky right infrahilar atelectasis or infiltrate.   Electronically Signed   By: Lahoma Crocker M.D.   On: 10/13/2014 22:45    Microbiology: Recent Results (from the past 240 hour(s))  MRSA PCR Screening     Status: None   Collection Time: 10/14/14  2:55 AM  Result Value Ref Range Status   MRSA by PCR NEGATIVE NEGATIVE Final    Comment:        The GeneXpert MRSA Assay (FDA approved for NASAL specimens only), is one component of a comprehensive MRSA colonization surveillance program. It is not intended to diagnose MRSA infection nor to guide or monitor treatment for MRSA infections.      Labs: Basic Metabolic Panel:  Recent Labs Lab 10/14/14 0500 10/14/14 1940 10/15/14 0245 10/16/14 0400 10/16/14 0934 10/17/14 0225  NA  --  144 144 142 145 145  K  --  4.2 5.1 5.1 5.5* 4.7  CL  --  104 105 105 107 102  CO2  --  30 30 32 30 34*  GLUCOSE  --  99 153* 153* 279* 69  BUN  --  15 19 31* 31* 28*  CREATININE  --  0.62 0.80 0.84 0.91 0.65  CALCIUM  --  8.4* 8.1* 8.0* 8.4* 8.3*  MG 1.8 1.2*  --   --   --   --   PHOS  --   --  4.7* 4.3  --   --    Liver Function Tests:  Recent Labs Lab 10/13/14 2226 10/15/14 0245 10/16/14 0400 10/17/14 0225  AST 78*  --   --  40  ALT 38  --   --  39  ALKPHOS 100  --   --  71  BILITOT 0.7  --   --  0.4  PROT 6.7  --   --  5.7*  ALBUMIN 3.5 3.4* 3.3* 3.2*   No  results for input(s): LIPASE, AMYLASE in the last 168 hours. No results for input(s): AMMONIA in the last 168 hours. CBC:  Recent Labs Lab 10/13/14 2226 10/14/14 0332 10/16/14 0400 10/17/14 0225  WBC 6.1 6.3 6.2 6.3  NEUTROABS  --   --  5.9 5.5  HGB 11.5* 10.0* 10.7* 10.6*  HCT 36.5 32.4* 34.7* 34.5*  MCV 92.9 92.0 95.1 95.0  PLT 177 164 161 159   Cardiac Enzymes: No results for input(s): CKTOTAL, CKMB, CKMBINDEX, TROPONINI in the last 168 hours. BNP: BNP (last 3 results)  Recent Labs  06/12/14 2059 06/14/14 0405 10/13/14 2224  BNP 1133.0* 1653.5* 3407.8*    ProBNP (last 3 results)  Recent Labs  06/12/14 1450  PROBNP 1395.0*    CBG:  Recent Labs Lab 10/17/14 2342 10/18/14 0343 10/18/14 0811 10/18/14 1151 10/19/14 0638  GLUCAP 94 141* 108* 158* 144*       Signed:  Nita Sells  [facility] 10/19/2014, 10:43 AM

## 2014-10-19 NOTE — Care Management (Signed)
Important Message  Patient Details  Name: Susan Browning MRN: [id number] Date of Birth: 13-Jun-1920   Medicare Important Message Given:  Yes-third notification given    Nathen May 10/19/2014, 2:18 PM

## 2014-10-19 NOTE — Care Management (Signed)
Important Message  Patient Details  Name: Susan Browning MRN: [id number] Date of Birth: 1920/12/01   Medicare Important Message Given:  Yes-third notification given    Dawayne Patricia, RN 10/19/2014, 10:32 AM

## 2014-10-19 NOTE — Telephone Encounter (Signed)
I contacted hospice and advised Dr. Loanne Drilling would be the attending and hospice doctors would help with symptom management.

## 2014-10-19 NOTE — Telephone Encounter (Signed)
ok 

## 2014-11-14 ENCOUNTER — Other Ambulatory Visit: Payer: Self-pay | Admitting: Endocrinology

## 2014-11-16 ENCOUNTER — Telehealth: Payer: Self-pay | Admitting: Endocrinology

## 2014-11-16 NOTE — Telephone Encounter (Signed)
Malinda calling regarding rx and visit coming

## 2014-11-16 NOTE — Telephone Encounter (Signed)
As long as you vary the time of day, you can check once per day

## 2014-11-16 NOTE — Telephone Encounter (Signed)
I contacted the pt's daughter. She stated since the pt's was released from the hospital she has been checking her blood sugar 3 times a day versus 1 times per day. Can we increase the frequency?  Pt's daughter stated checking 3 times per day has been working well for the pt.

## 2014-11-17 MED ORDER — GLUCOSE BLOOD VI STRP: ORAL_STRIP | Status: DC

## 2014-11-17 NOTE — Addendum Note (Signed)
Addended by: Moody Bruins E on: 11/17/2014 04:05 PM   Modules accepted: Orders

## 2014-11-17 NOTE — Telephone Encounter (Signed)
Tried to contact the pt's daughter. Will try again at a later time.

## 2014-11-17 NOTE — Telephone Encounter (Signed)
Left voicemail advising of note below. Requested call back if the daughter would like to discuss.

## 2014-11-25 ENCOUNTER — Ambulatory Visit (INDEPENDENT_AMBULATORY_CARE_PROVIDER_SITE_OTHER): Payer: Medicare Other | Admitting: Endocrinology

## 2014-11-25 ENCOUNTER — Encounter: Payer: Self-pay | Admitting: Endocrinology

## 2014-11-25 VITALS — BP 124/64 | HR 88 | Wt 110.0 lb

## 2014-11-25 DIAGNOSIS — E119 Type 2 diabetes mellitus without complications: Secondary | ICD-10-CM | POA: Diagnosis not present

## 2014-11-25 LAB — POCT GLYCOSYLATED HEMOGLOBIN (HGB A1C): Hemoglobin A1C: 5.7

## 2014-11-25 MED ORDER — GLUCOSE BLOOD VI STRP: 1.0000 | ORAL_STRIP | Freq: Three times a day (TID) | Status: DC

## 2014-11-25 MED ORDER — GLUCOSE BLOOD VI STRP: ORAL_STRIP | Status: DC

## 2014-11-25 NOTE — Progress Notes (Signed)
Subjective:    Patient ID: Susan Browning, female    DOB: 12-11-1920, 81 y.o.   MRN: [id number]  HPI  The state of at least three ongoing medical problems is addressed today, with interval history of each noted here: Pt was widowed 5/16.  Lives with granddaughter.  Here with son Susan Browning).  Pt is now on hospice. DM: no cbg record, but states cbg's vary from 34-300. CHF: no sob anxiety and HTN:  morphine helps  Past Medical History  Diagnosis Date  . DIABETES MELLITUS, TYPE II 01/03/2007  . HYPERCHOLESTEROLEMIA 06/01/2009  . ANEMIA, IRON DEFICIENCY 08/15/2007  . LEUKOPENIA, CHRONIC 06/01/2009  . HYPERTENSION 01/03/2007  . ALLERGIC RHINITIS 01/03/2007  . OSTEOARTHRITIS 01/03/2007  . SHOULDER PAIN, LEFT 02/18/2008  . OSTEOPOROSIS 01/03/2007  . NONSPECIFIC ABNORMAL ELECTROCARDIOGRAM 02/12/2007  . COLONIC POLYPS, HX OF 01/03/2007  . Diabetic retinopathy   . Smoker   . OA (osteoarthritis)     Past Surgical History  Procedure Laterality Date  . Child birth      x's 5  . Dilation and curettage of uterus      Social History   Social History  . Marital Status: Married    Spouse Name: N/A  . Number of Children: N/A  . Years of Education: N/A   Occupational History  . Not on file.   Social History Main Topics  . Smoking status: Current Every Day Smoker -- 0.50 packs/day    Types: Cigarettes  . Smokeless tobacco: Not on file  . Alcohol Use: No  . Drug Use: No  . Sexual Activity: Not on file   Other Topics Concern  . Not on file   Social History Narrative    Current Outpatient Prescriptions on File Prior to Visit  Medication Sig Dispense Refill  . aspirin EC 81 MG tablet Take 81 mg by mouth daily.    . furosemide (LASIX) 20 MG tablet TAKE 1 TABLET (20 MG TOTAL) BY MOUTH DAILY. 30 tablet 3  . metFORMIN (GLUCOPHAGE) 1000 MG tablet TAKE 1 TABLET (1,000 MG TOTAL) BY MOUTH 2 (TWO) TIMES DAILY WITH A MEAL. 180 tablet 1  . Morphine Sulfate (MORPHINE CONCENTRATE) 10 MG/0.5ML  SOLN concentrated solution Take 0.25 mLs (5 mg total) by mouth every 4 (four) hours as needed for shortness of breath. 42 mL 0   No current facility-administered medications on file prior to visit.    Allergies  Allergen Reactions  . Penicillins     Family History  Problem Relation Age of Onset  . Diabetes Sister   . Liver disease Daughter     Cirrhosis-transplant  . Colon cancer Neg Hx   . Breast cancer Maternal Aunt     BP 124/64 mmHg  Pulse 88  Wt 110 lb (49.896 kg)  SpO2 96%    Review of Systems Denies LOC and weight change.    Objective:   Physical Exam Vital signs: see vs page Gen: elderly, frail, no distress.  In wheelchair.  Has 02 on  LUNGS:  Clear to auscultation, except for a few rales at the bases   A1c=5.7%    Assessment & Plan:  DM: overcontrolled CHF: well-controlled Anxiety/HTN: well-controlled. Pt will continue same.   Patient is advised the following: Patient Instructions  Please continue the same fluid pill.   check your blood sugar once or twice a day.  vary the time of day when you check, between before the 3 meals, and at bedtime.  also check if  you have symptoms of your blood sugar being too high or too low.  please keep a record of the readings and bring it to your next appointment here.  You can write it on any piece of paper.  please call us sooner if your blood sugar goes below 70, or if you have a lot of readings over 200.  Your a1c was 5.7%.  This is too low.  Therefore, please reduce the repaglinide to 1/2 of 0.5 mg 3 times a day (just before each meal).   Please come back for a follow-up appointment in 3 months.

## 2014-11-25 NOTE — Patient Instructions (Addendum)
Please continue the same fluid pill.   check your blood sugar once or twice a day.  vary the time of day when you check, between before the 3 meals, and at bedtime.  also check if you have symptoms of your blood sugar being too high or too low.  please keep a record of the readings and bring it to your next appointment here.  You can write it on any piece of paper.  please call us sooner if your blood sugar goes below 70, or if you have a lot of readings over 200.  Your a1c was 5.7%.  This is too low.  Therefore, please reduce the repaglinide to 1/2 of 0.5 mg 3 times a day (just before each meal).   Please come back for a follow-up appointment in 3 months.

## 2014-11-25 NOTE — Progress Notes (Signed)
Pre visit review using our clinic review tool, if applicable. No additional management support is needed unless otherwise documented below in the visit note. 

## 2014-11-26 ENCOUNTER — Other Ambulatory Visit: Payer: Self-pay

## 2014-11-26 ENCOUNTER — Telehealth: Payer: Self-pay | Admitting: Endocrinology

## 2014-11-26 MED ORDER — FUSION PLUS PO CAPS: 1.0000 | ORAL_CAPSULE | Freq: Every day | ORAL | Status: AC

## 2014-11-26 MED ORDER — REPAGLINIDE 0.5 MG PO TABS: ORAL_TABLET | ORAL | Status: AC

## 2014-11-26 NOTE — Telephone Encounter (Signed)
Pt's daughter called about iron supplement. She wanted to know if a iron supplement could be prescribed instead of taking the over the counter supplement. Daughter recommended fusion plus 722. The daughter has been on this for a while and it has helped her and wanted to know if it could be prescribed for her mother. Please advise, Thanks!

## 2014-11-26 NOTE — Telephone Encounter (Signed)
Susan Browning calling for rx for repaelinide to be called in with reduced dosing  She is confused is she supposed to spit the tabs  Also wants to speak about the A1C and possibly rx a iron supplement  Please call her back

## 2014-11-26 NOTE — Telephone Encounter (Signed)
Ok, i have sent a prescription to your pharmacy  

## 2014-11-27 NOTE — Telephone Encounter (Signed)
Pt's daughter advised rx has been sent. She states the medication will cost 40 dollars. She is going to call her mom's insurance and see if a PA needs to be submitted.

## 2014-12-01 ENCOUNTER — Ambulatory Visit: Payer: Medicare Other | Admitting: Family Medicine

## 2014-12-23 ENCOUNTER — Telehealth: Payer: Self-pay

## 2014-12-23 NOTE — Telephone Encounter (Signed)
Susan Browning with home health called. Pt has been experiencing an exacerbation with copd for the past week. Pt has had severe labored breathing with any activity. Home Health doctor was notified and an Rx for Prednisone 10mg  daily was written for 1 time daily. Nurse wanted you to be notified.

## 2015-02-08 ENCOUNTER — Other Ambulatory Visit: Payer: Self-pay | Admitting: Endocrinology

## 2015-02-15 DIAGNOSIS — F29 Unspecified psychosis not due to a substance or known physiological condition: Secondary | ICD-10-CM | POA: Diagnosis not present

## 2015-02-15 DIAGNOSIS — F419 Anxiety disorder, unspecified: Secondary | ICD-10-CM | POA: Diagnosis not present

## 2015-02-26 ENCOUNTER — Ambulatory Visit: Payer: Medicare Other | Admitting: Endocrinology

## 2015-02-26 DIAGNOSIS — Z0289 Encounter for other administrative examinations: Secondary | ICD-10-CM

## 2015-03-13 ENCOUNTER — Other Ambulatory Visit: Payer: Self-pay | Admitting: Endocrinology

## 2015-03-24 DIAGNOSIS — R069 Unspecified abnormalities of breathing: Secondary | ICD-10-CM | POA: Diagnosis not present

## 2015-04-07 ENCOUNTER — Encounter: Payer: Self-pay | Admitting: Gastroenterology

## 2015-07-08 ENCOUNTER — Telehealth: Payer: Self-pay

## 2015-07-08 NOTE — Telephone Encounter (Signed)
lmom for patient regarding flu shot survey/call

## 2015-08-03 ENCOUNTER — Telehealth: Payer: Self-pay | Admitting: Endocrinology

## 2015-08-03 NOTE — Telephone Encounter (Signed)
I contacted [facility] with Hospice. He stated over the last few weeks the pt has been experincing edema in both legs and SOB and now has developed coarse crackles in both of her lungs. Pt is currently taking 20 mg of furosemide daily and Paul with Hospice wanted to check if we could make a med adjustment based off his findings.

## 2015-08-03 NOTE — Telephone Encounter (Signed)
Please take an extra lasix today. If I am managing patient, I should see her in the office tomorrow. If hospice is managing pt, please ask hospice to f/u.

## 2015-08-03 NOTE — Telephone Encounter (Signed)
I contacted Susan Browning with hospice and advised of note below. He voiced understanding. Susan Browning is going to reach out to the attending physician with hospice and verify if they will continue to follow up the pt with this issue. He did state the pt was not able to come for an appointment at this time.

## 2015-08-03 NOTE — Telephone Encounter (Signed)
Burnard Leigh form hospice called stating patient medication Lasixn needs adjusting, please advise phone # [phone]

## 2015-08-25 ENCOUNTER — Other Ambulatory Visit: Payer: Self-pay

## 2015-08-25 MED ORDER — METFORMIN HCL 1000 MG PO TABS: ORAL_TABLET | ORAL | Status: DC

## 2015-10-28 ENCOUNTER — Telehealth: Payer: Self-pay | Admitting: Endocrinology

## 2015-10-28 IMAGING — CR DG CHEST 2V
2 series · 2 of 2 positions shown · non-contrast
Comparison: Portable chest x-ray October 13, 2014

CLINICAL DATA: Episode of shortness of breath last evening,
currently clinically improved

EXAM:
CHEST  2 VIEW

[chest lat]
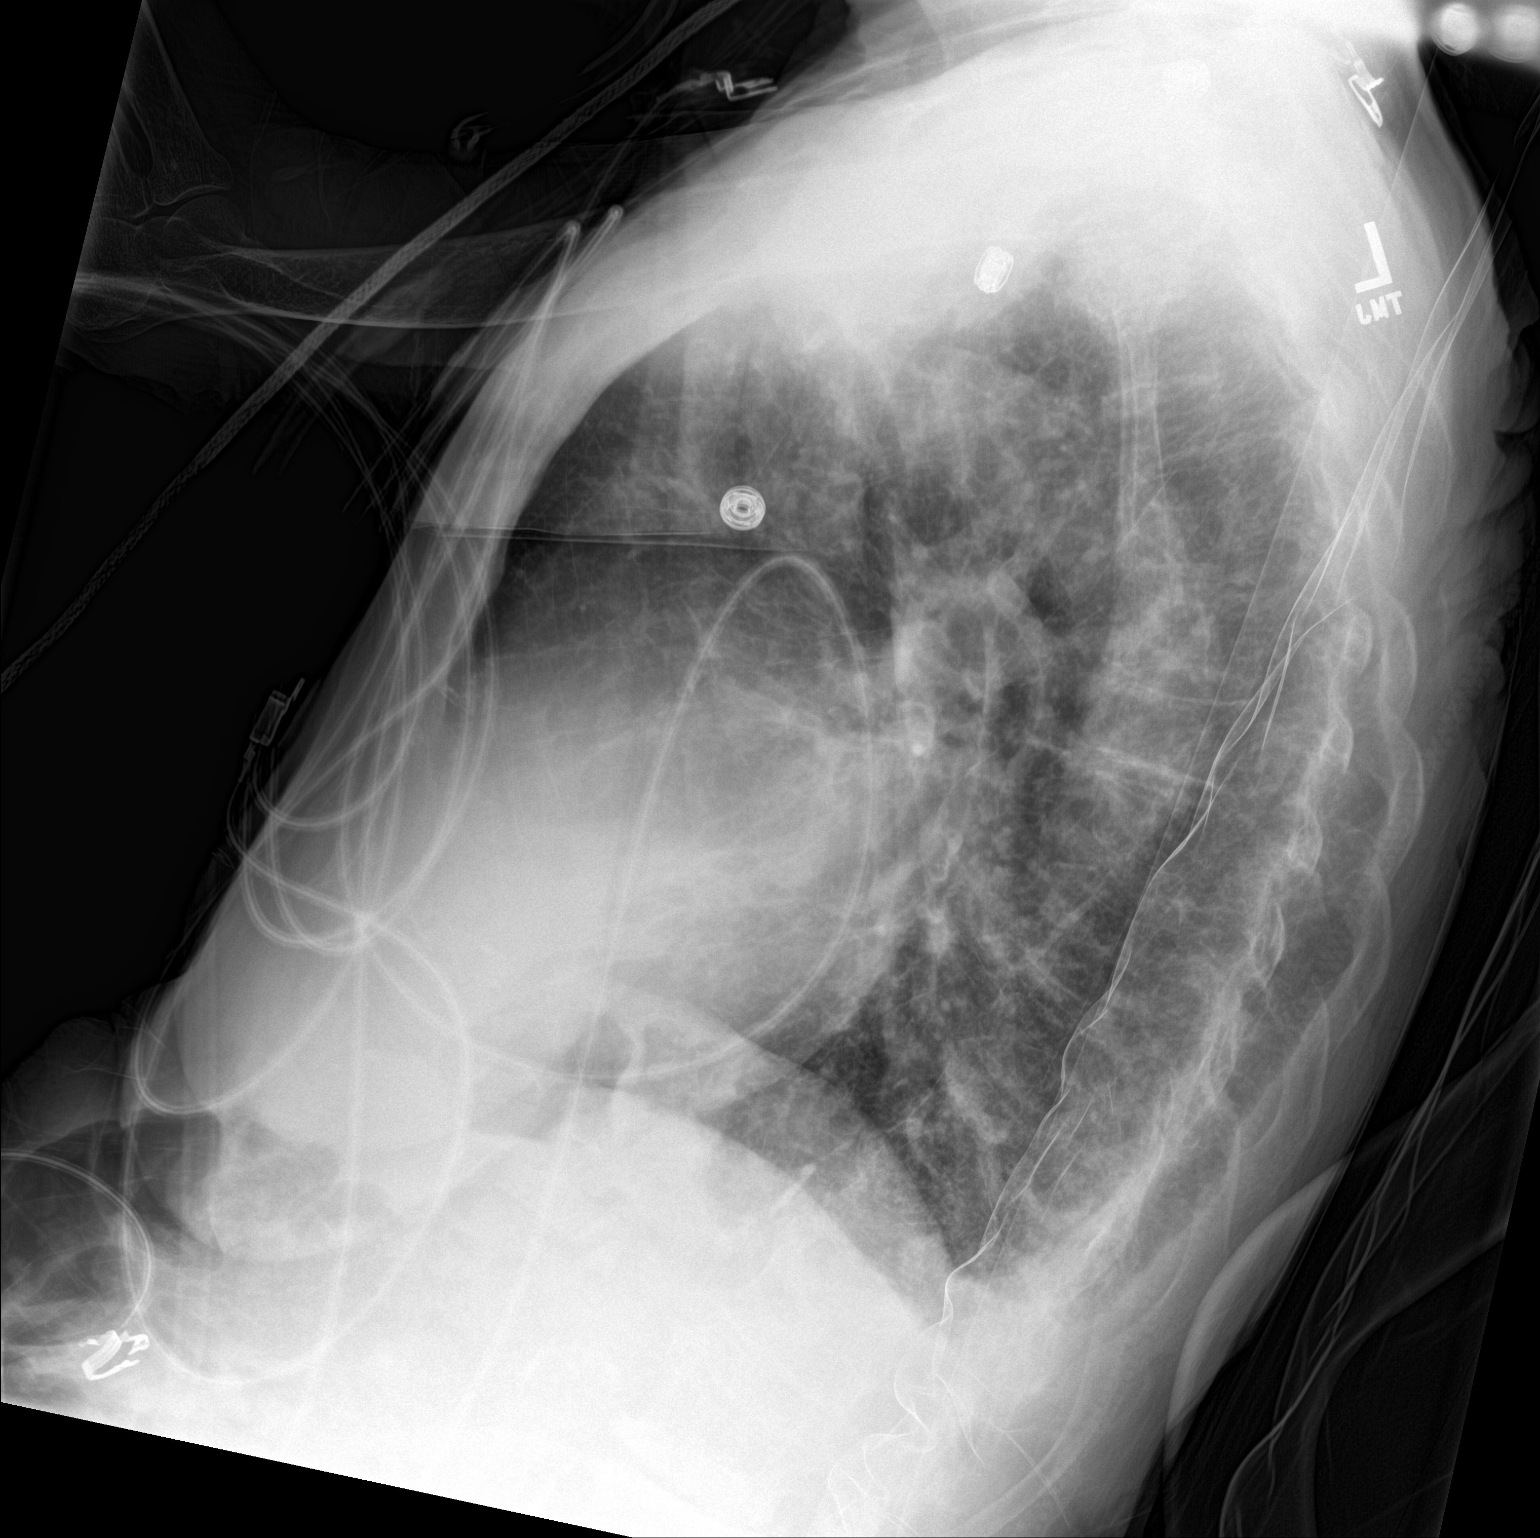

[chest ap]
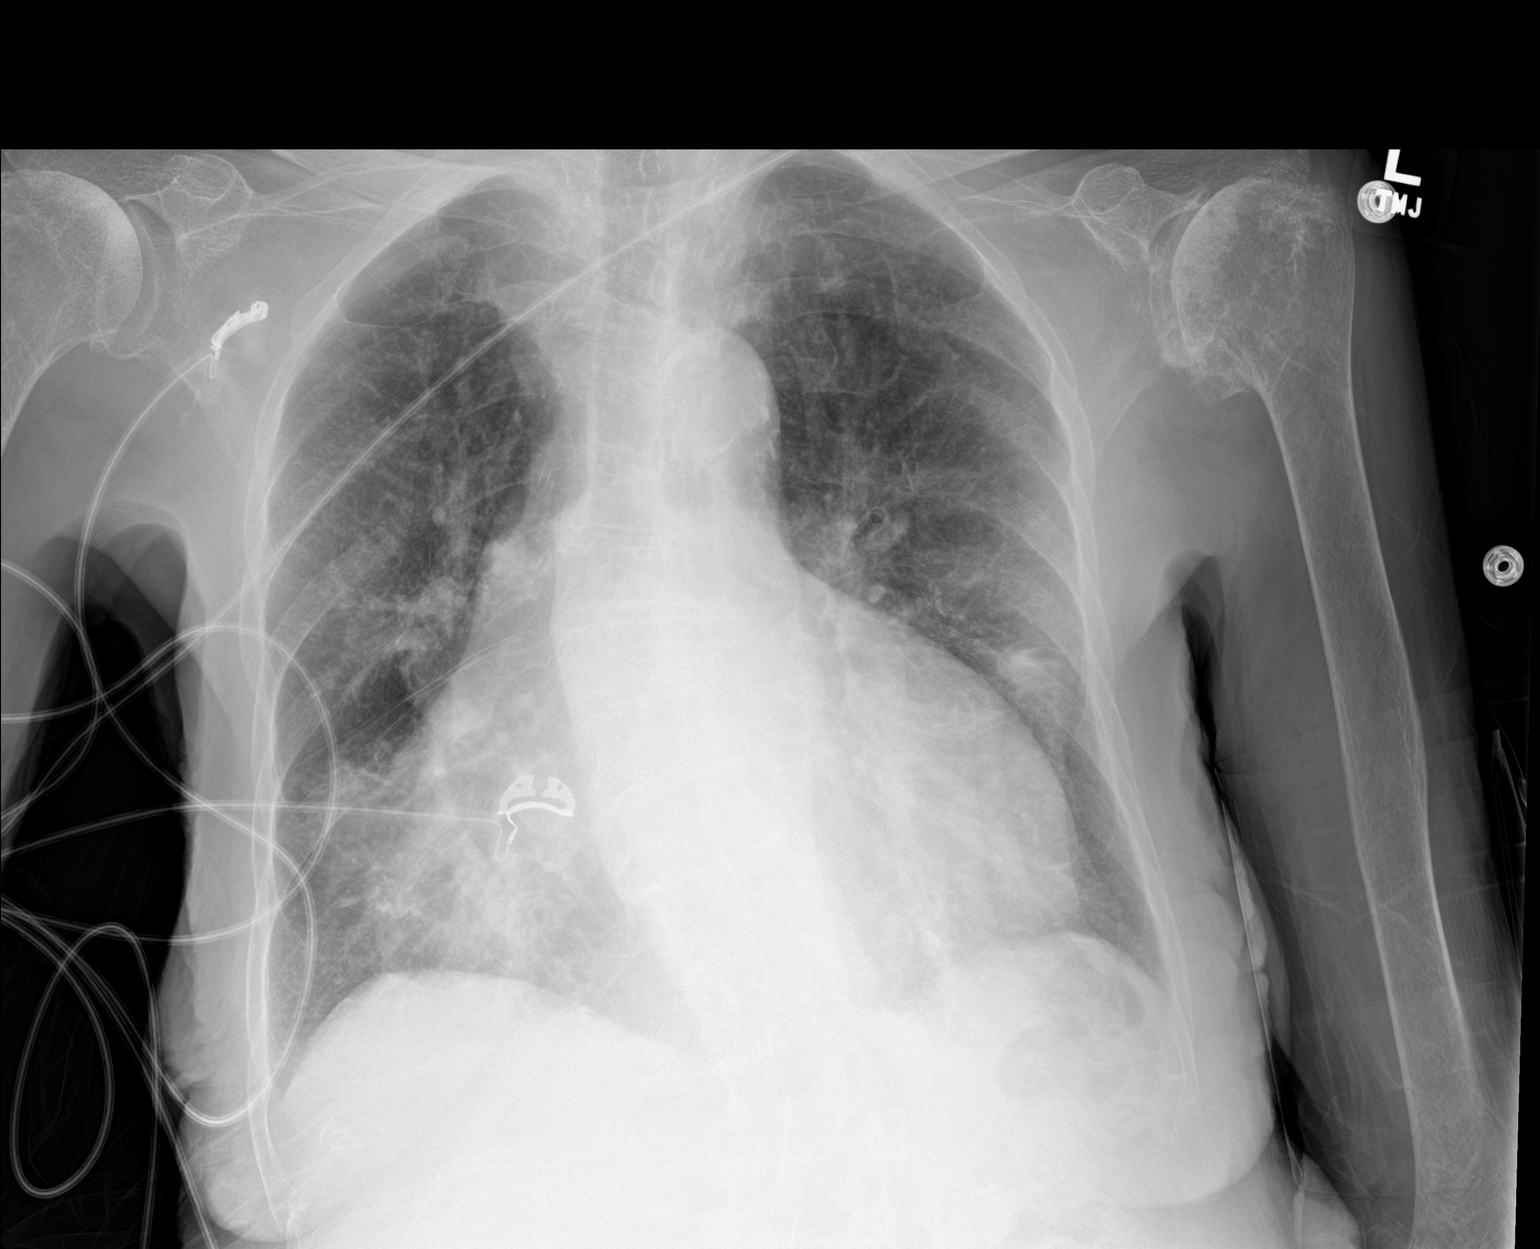

[2 of 2 positions shown; findings below may reference images not displayed]

FINDINGS: The cardiopericardial silhouette remains enlarged. The central
pulmonary vascularity is mildly engorged and slightly more
conspicuous today. The pulmonary interstitial markings are increased
bilaterally as compared to the previous study. There remain areas of
patchy increased density in the right infrahilar region. There is
minimal blunting of the posterior costophrenic angles bilaterally.
There are degenerative changes of the left glenohumeral joint. The
thoracic spine is not well evaluated on the lateral view. No
high-grade compression is observed.
IMPRESSION: COPD with low-grade CHF. When compared to the previous study the
pulmonary vascularity and interstitial markings are slightly more
conspicuous.

## 2015-10-28 NOTE — Telephone Encounter (Signed)
As pt is unable to come for ov's, hospice needs to assume care for this patient

## 2015-10-28 NOTE — Telephone Encounter (Signed)
See note below and please advise, Thanks! 

## 2015-10-28 NOTE — Telephone Encounter (Signed)
Paul with [facility] called, wanted to OK with Dr. Loanne Drilling if he can start this PT on Remeron at night? CB# [phone]

## 2015-10-29 NOTE — Telephone Encounter (Signed)
I contacted Eddie Dibbles with Hospice and advised Dr. Loanne Drilling would like to request for remeron at bed time to come from the covering Hospice MD due to the pt not being seen in almost a year. Eddie Dibbles voiced understanding and will request the order from the Hospice MD.

## 2015-11-01 IMAGING — CR DG CHEST 2V
2 series · 2 of 2 positions shown · non-contrast
Comparison: 10/15/2014 and prior studies

CLINICAL DATA: [AGE] female with pneumonia.

EXAM:
CHEST  2 VIEW

[chest lat]
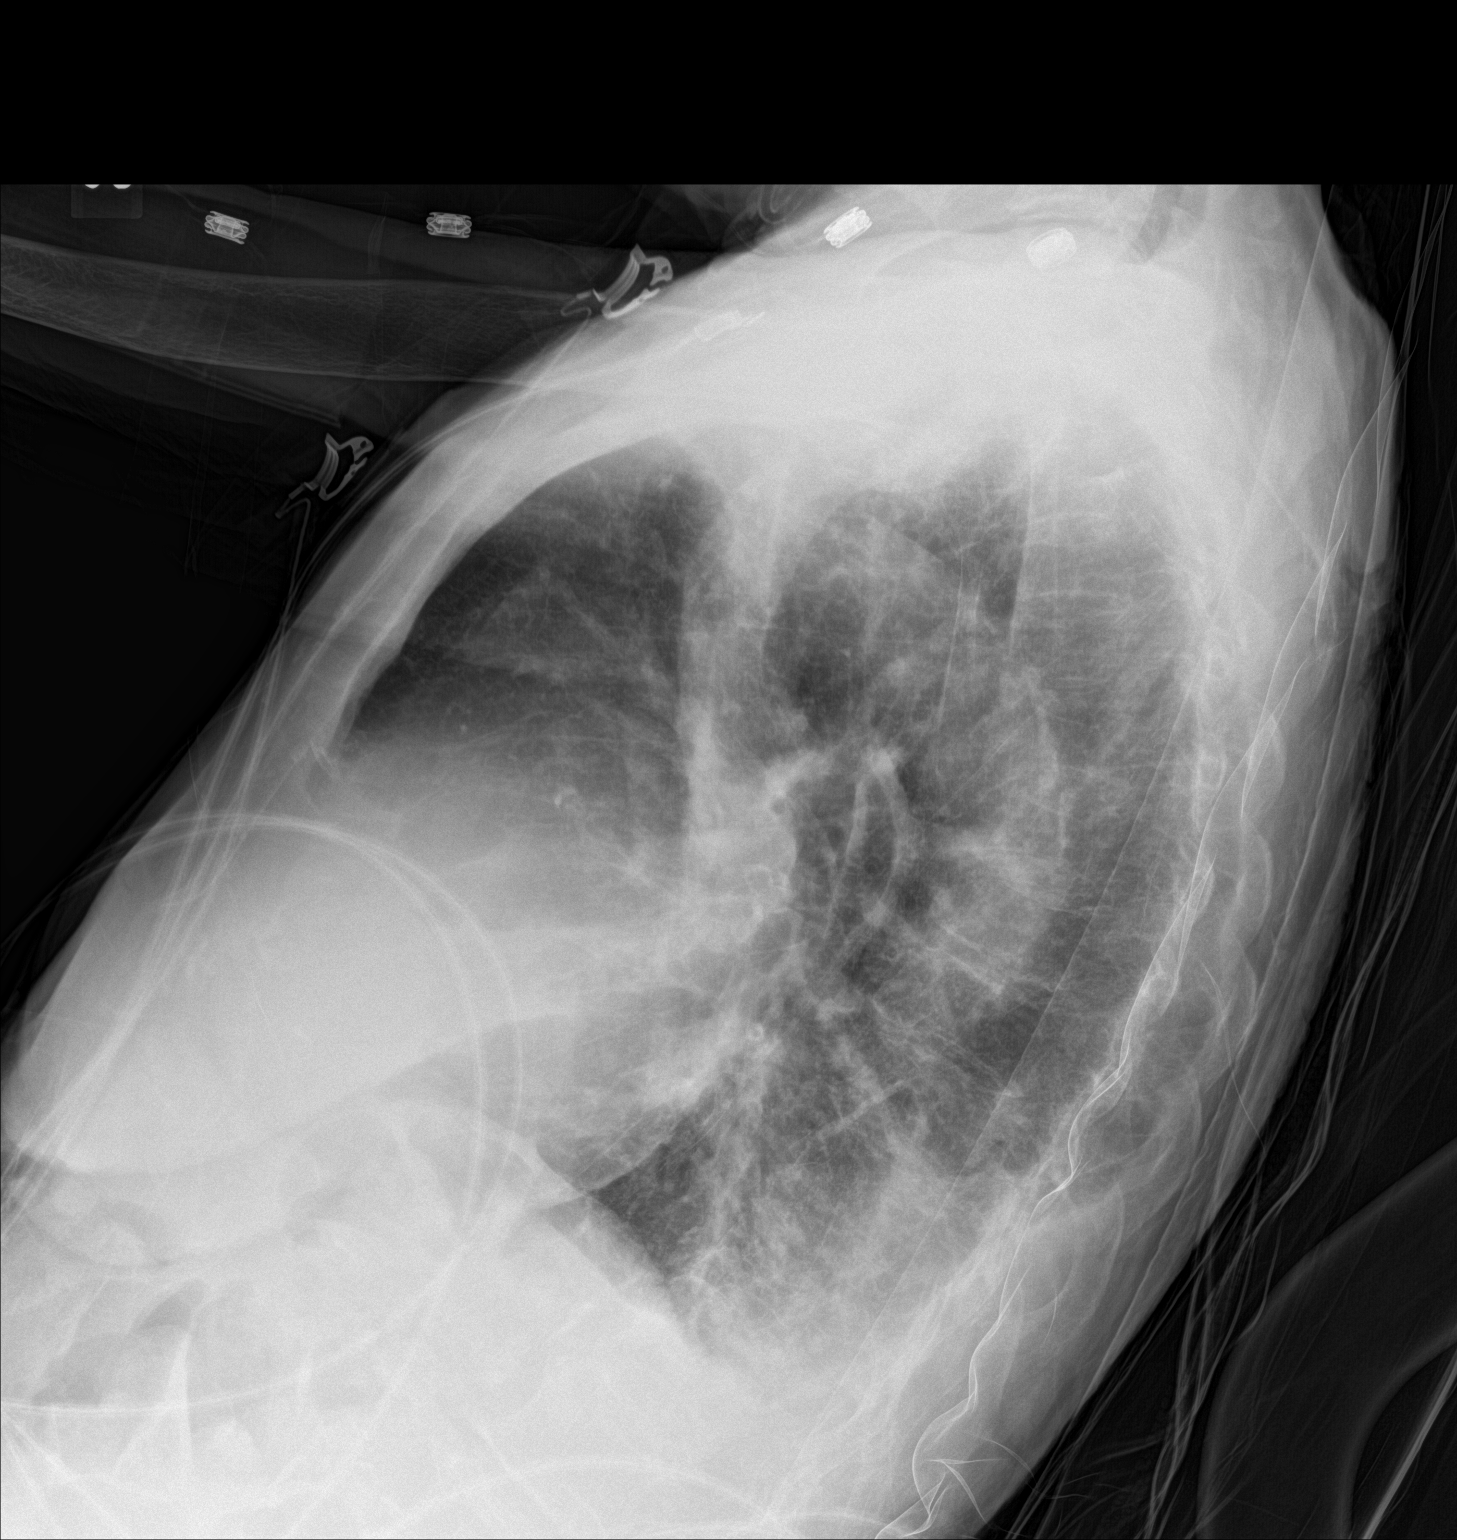

[chest ap]
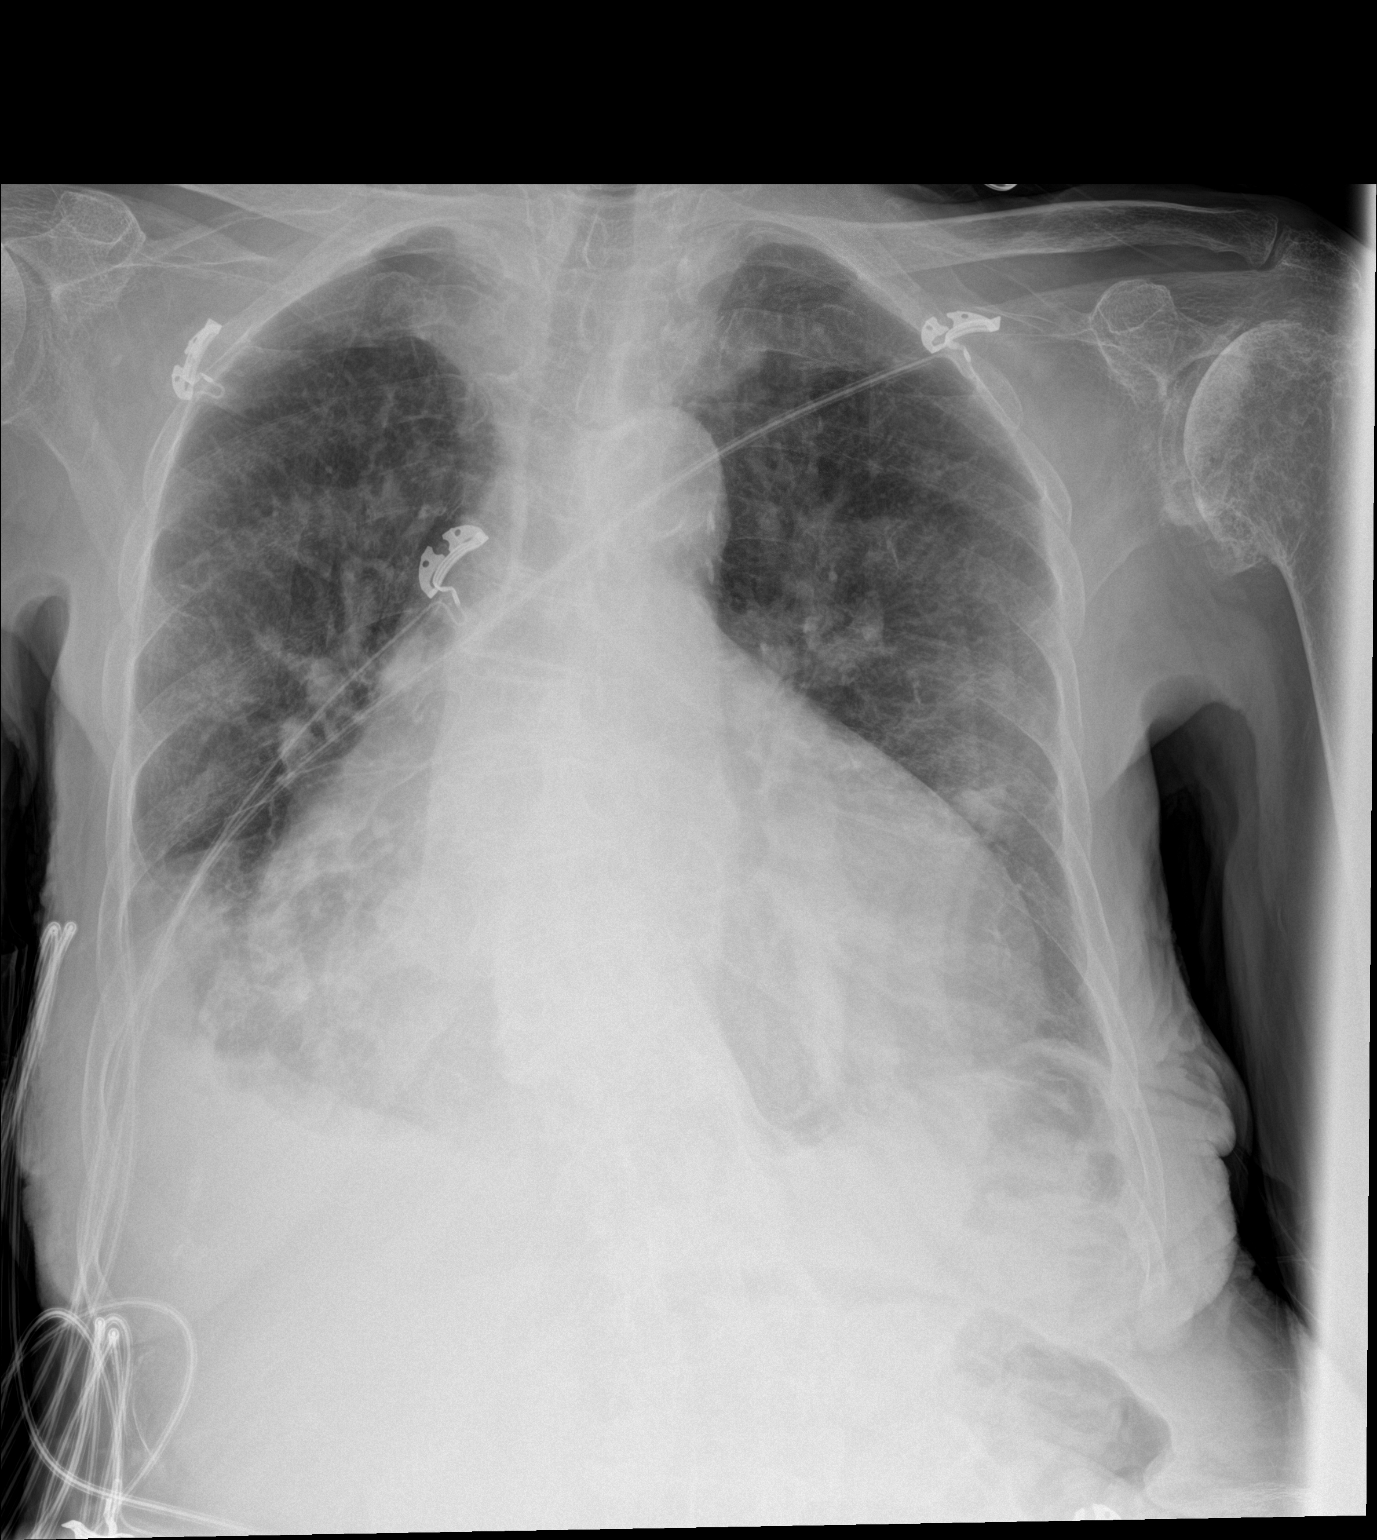

[2 of 2 positions shown; findings below may reference images not displayed]

FINDINGS: Marked enlargement of the cardiopericardial silhouette is noted.

New right lower lobe consolidation and small right pleural effusion
noted. Right middle lobe atelectasis/ airspace disease again noted.

Mild interstitial opacities are identified and mild interstitial
edema is not excluded.

Left basilar atelectasis is unchanged.

There is no evidence of pneumothorax.
IMPRESSION: New right lower lobe consolidation/ pneumonia and small right
pleural effusion.

Mild interstitial opacities -mild interstitial edema not excluded.

Unchanged right middle lobe atelectasis/ airspace disease, left
basilar atelectasis and marked enlargement of the cardiopericardial
silhouette.

## 2015-12-04 ENCOUNTER — Other Ambulatory Visit: Payer: Self-pay | Admitting: Endocrinology

## 2015-12-24 ENCOUNTER — Other Ambulatory Visit: Payer: Self-pay | Admitting: Endocrinology

## 2016-01-09 DEATH — deceased

## 2016-01-10 ENCOUNTER — Telehealth: Payer: Self-pay | Admitting: Endocrinology

## 2016-01-10 NOTE — Telephone Encounter (Signed)
Pt has been under the care of hospice dr for the past year.  Is that dr declining to sign?

## 2016-01-10 NOTE — Telephone Encounter (Signed)
See message and please advise, Thanks!  

## 2016-01-10 NOTE — Telephone Encounter (Signed)
Baxter Flattery from [facility]. Need Dr Loanne Drilling to sign her death certificate.

## 2016-01-11 NOTE — Telephone Encounter (Signed)
Funeral home director advised the death certificate needs to be signed by the hospice MD because the patient had not been seen in over a year.

## 2016-01-11 NOTE — Telephone Encounter (Signed)
Susan Browning, Did Baxter Flattery leave a call back number?

## 2016-01-11 NOTE — Telephone Encounter (Signed)
Susan Browning from [facility]. Need Dr Loanne Drilling to sign her death certificate.       Phone # [phone]

## 2016-03-07 ENCOUNTER — Other Ambulatory Visit: Payer: Self-pay | Admitting: Endocrinology
